# Patient Record
Sex: Male | Born: 1960 | Race: White | Hispanic: No | Marital: Married | Smoking: Current every day smoker
Health system: Southern US, Community
[De-identification: ages and names within clinical notes are randomized; demographics above are authoritative.]

## PROBLEM LIST (undated history)

## (undated) DIAGNOSIS — Z720 Tobacco use: Secondary | ICD-10-CM

---

## 2011-06-10 ENCOUNTER — Emergency Department: Payer: Self-pay | Admitting: Emergency Medicine

## 2011-06-10 IMAGING — CT CT HEAD WITHOUT CONTRAST
2 series · 15 of 30 positions shown, 19 images · non-contrast
Comparison: none

REASON FOR EXAM: forehead hematoma
COMMENTS:   LMP: (Male)

[Series 2: without · axial · non-contrast · 0.47mm/px · z∈[-173,-48]mm · 13 of 31 slices shown, 17 images]
[im 3/31  brain]
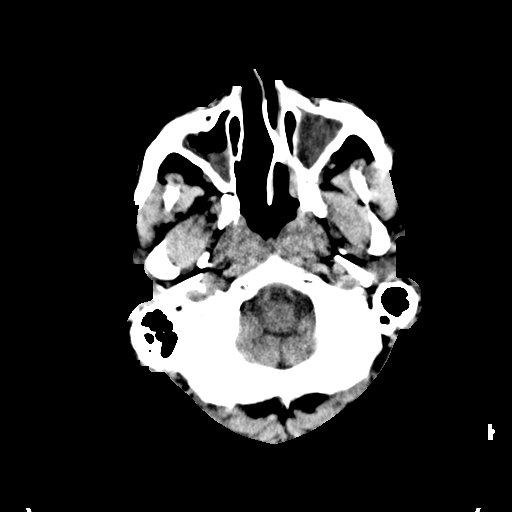
[im 3/31  bone]
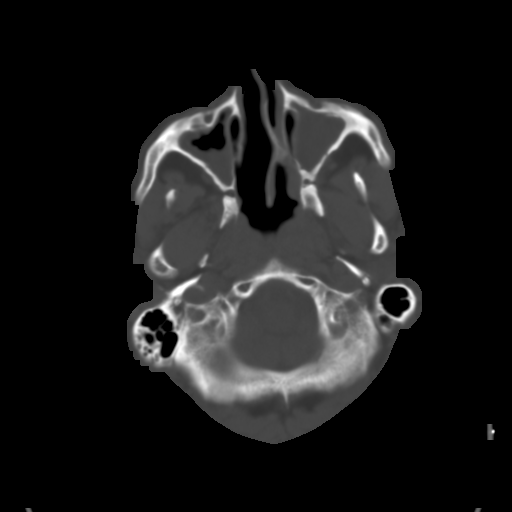
[im 5/31  brain]
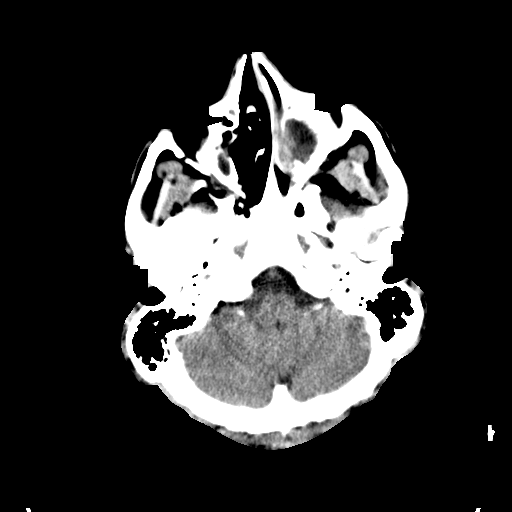
[im 7/31  brain]
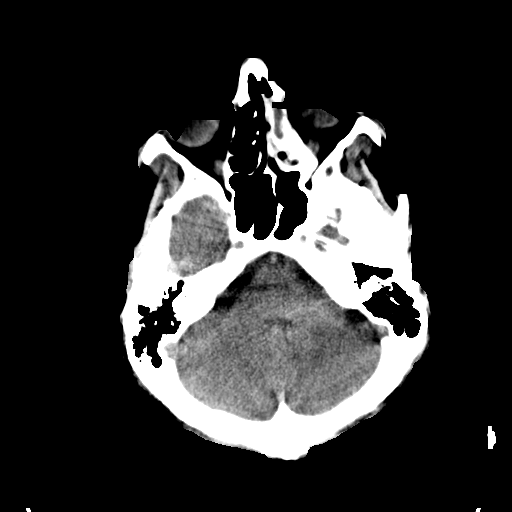
[im 9/31  brain]
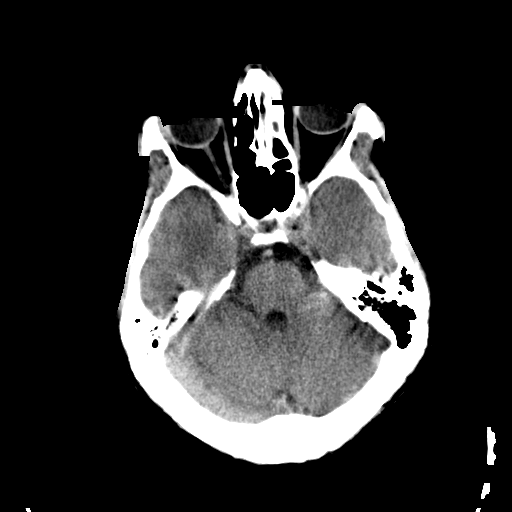
[im 11/31  brain]
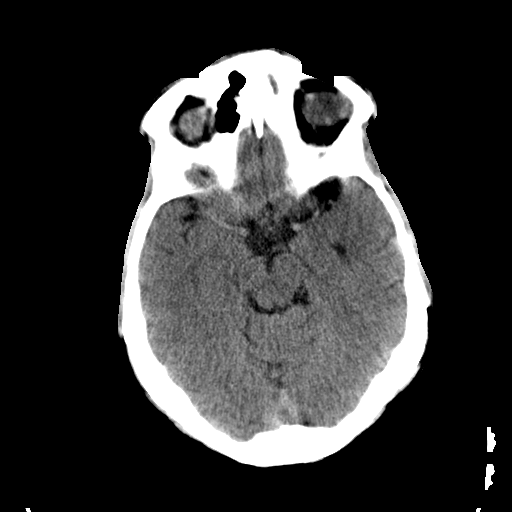
[im 11/31  bone]
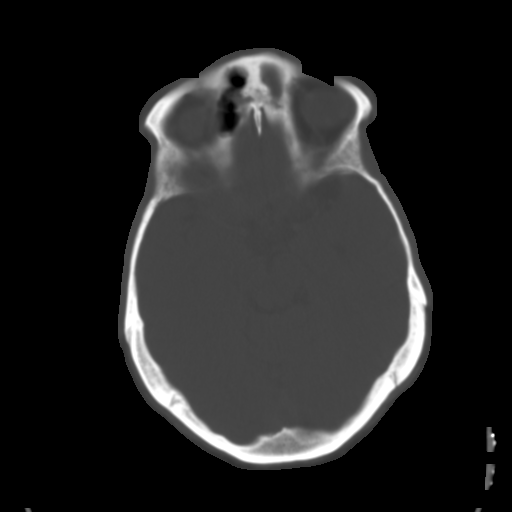
[im 13/31  brain]
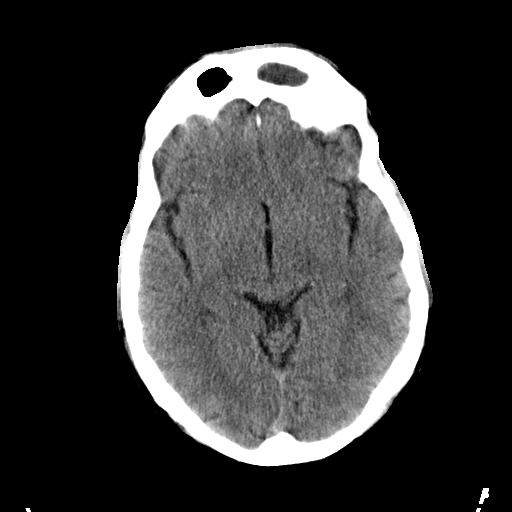
[im 16/31  brain]
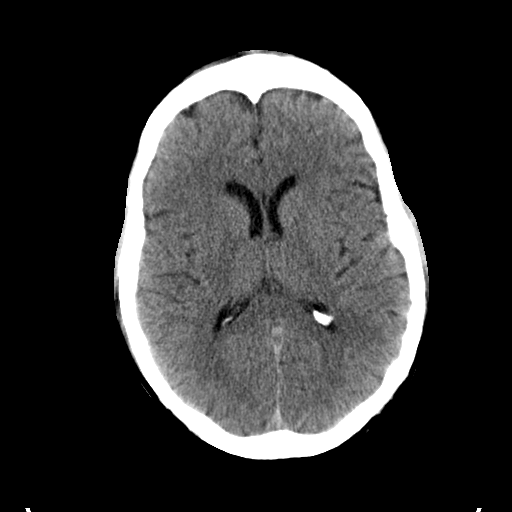
[im 18/31  brain]
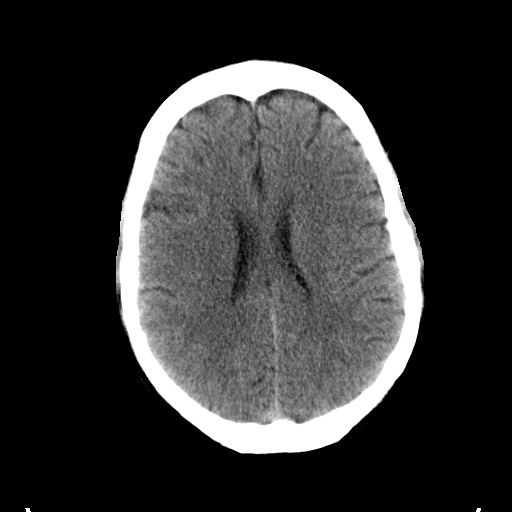
[im 20/31  brain]
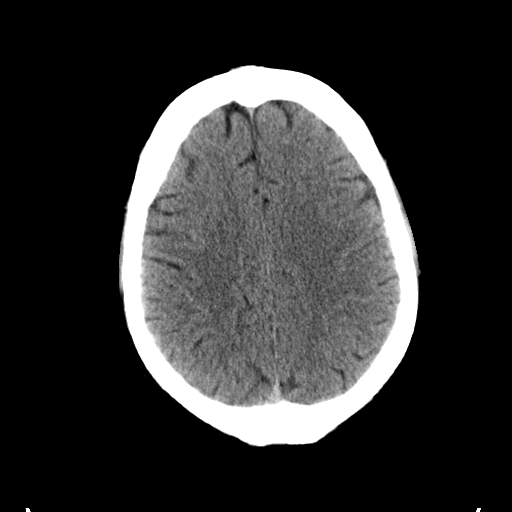
[im 20/31  bone]
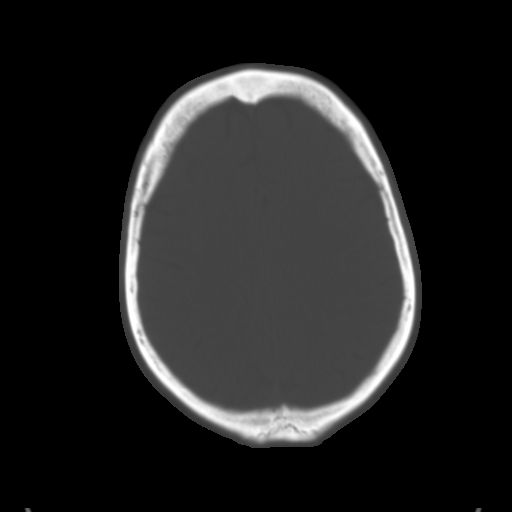
[im 22/31  brain]
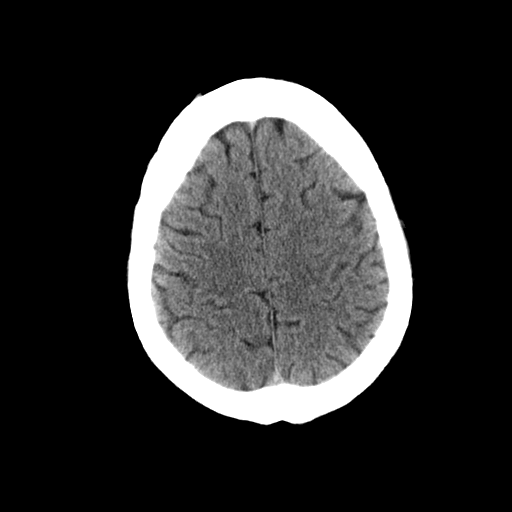
[im 24/31  brain]
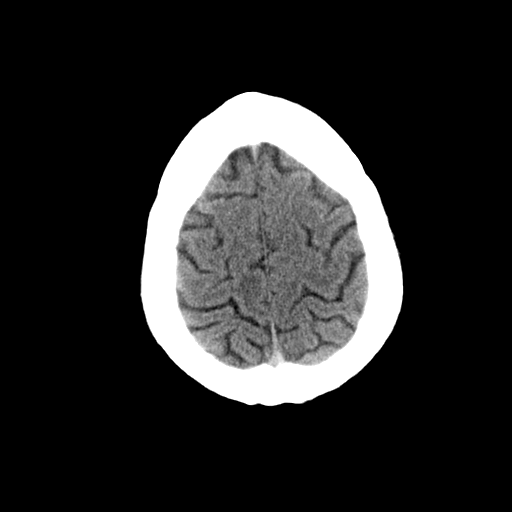
[im 26/31  brain]
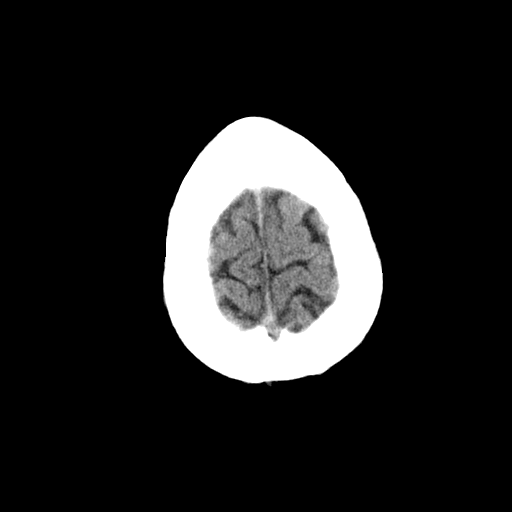
[im 28/31  brain]
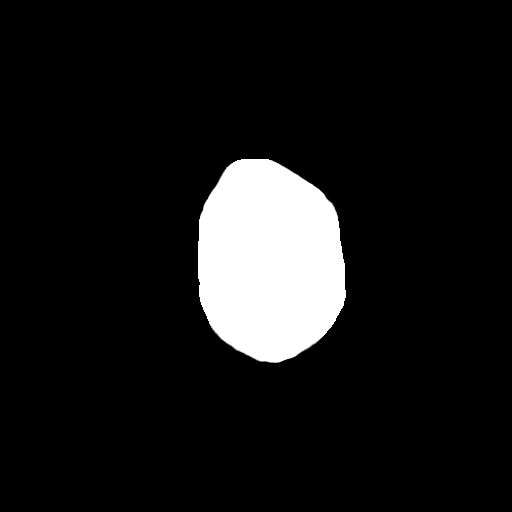
[im 28/31  bone]
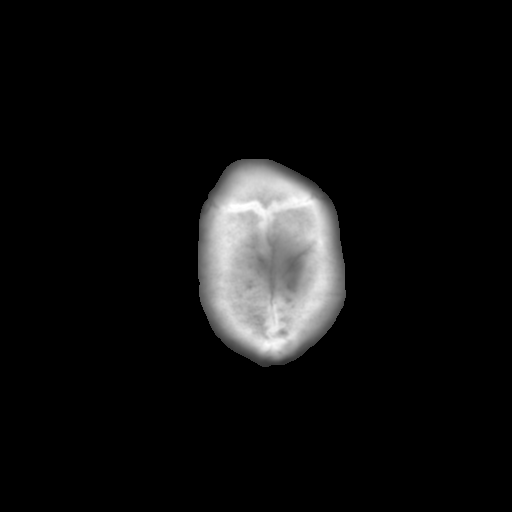

[Series 3: bone · axial · 0.47mm/px · z∈[-173,-153]mm · 2 of 31 slices shown]
[im 3/31  bone]
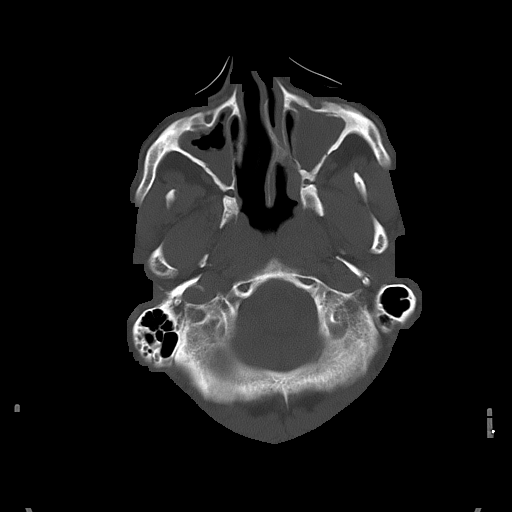
[im 7/31  bone]
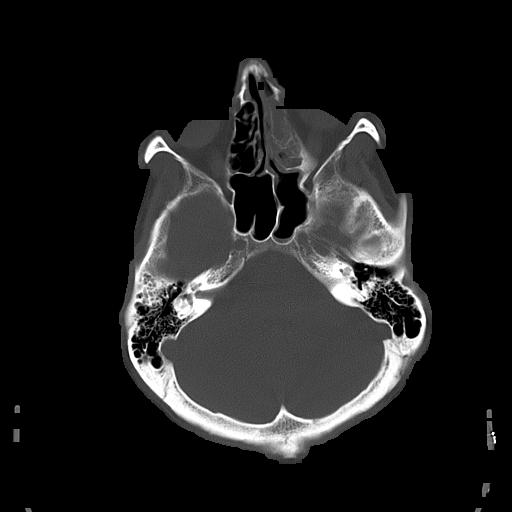

[15 of 30 positions shown; findings below may reference images not displayed]

PROCEDURE:     CT  - CT HEAD WITHOUT CONTRAST  - [DATE] [DATE]

RESULT:     Axial noncontrast CT scanning was performed through the brain
with reconstructions at 5 mm intervals and slice thicknesses.

The ventricles are normal in size and position. There is no intracranial
hemorrhage nor intracranial mass effect. There is no evidence of an evolving
ischemic infarction. There are no abnormal intracranial calcifications. The
cerebellum and brainstem are normal in density.

At bone window settings there is a cephalohematoma over the forehead. No
depressed skull fracture deep to the area of hematoma is seen. There is
opacification of the left frontal sinus and partial opacification of the
right frontal sinus. The left ethmoid and both maxillary sinuses are largely
opacified. The sphenoid sinuses are clear. The mastoid air cells are clear.
Linear lucencies along the posterior aspect of the frontal sinus on the left
are present and may be posttraumatic or developmental.
IMPRESSION: 1. There is no evidence of acute intracranial hemorrhage.
2. There are findings which likely reflect pan sinus inflammation consistent
with sinusitis. There are faint linear lucencies along the posterior wall of
the left frontal sinus which may communicate with the CSF space. This may be
acute or chronic.
3. There is a cephalohematoma over the forehead without evidence of a
depressed skull fracture.

Correlation with the duration of the patient's symptoms and the presence of
fever or headache prior to the head trauma is needed. It may be that some of
the patient's symptoms are merely due to severe sinus infection, but the
possibility of a previously infected frontal sinus communicating with the
anterior cranial fossa and CSF spaces is raised.

## 2011-06-18 ENCOUNTER — Emergency Department: Payer: Self-pay | Admitting: Emergency Medicine

## 2011-06-19 ENCOUNTER — Emergency Department: Payer: Self-pay | Admitting: Emergency Medicine

## 2019-06-19 ENCOUNTER — Other Ambulatory Visit: Payer: Self-pay

## 2019-06-19 DIAGNOSIS — K648 Other hemorrhoids: Secondary | ICD-10-CM | POA: Diagnosis not present

## 2019-06-19 DIAGNOSIS — K6289 Other specified diseases of anus and rectum: Secondary | ICD-10-CM | POA: Diagnosis present

## 2019-06-19 DIAGNOSIS — F17200 Nicotine dependence, unspecified, uncomplicated: Secondary | ICD-10-CM | POA: Insufficient documentation

## 2019-06-19 DIAGNOSIS — K4090 Unilateral inguinal hernia, without obstruction or gangrene, not specified as recurrent: Secondary | ICD-10-CM | POA: Diagnosis not present

## 2019-06-19 LAB — CBC
HCT: 42.4 % (ref 39.0–52.0)
Hemoglobin: 14.2 g/dL (ref 13.0–17.0)
MCH: 30.7 pg (ref 26.0–34.0)
MCHC: 33.5 g/dL (ref 30.0–36.0)
MCV: 91.8 fL (ref 80.0–100.0)
Platelets: 189 10*3/uL (ref 150–400)
RBC: 4.62 MIL/uL (ref 4.22–5.81)
RDW: 11.9 % (ref 11.5–15.5)
WBC: 9.5 10*3/uL (ref 4.0–10.5)
nRBC: 0 % (ref 0.0–0.2)

## 2019-06-19 LAB — COMPREHENSIVE METABOLIC PANEL
ALT: 12 U/L (ref 0–44)
AST: 18 U/L (ref 15–41)
Albumin: 4 g/dL (ref 3.5–5.0)
Alkaline Phosphatase: 56 U/L (ref 38–126)
Anion gap: 9 (ref 5–15)
BUN: 16 mg/dL (ref 6–20)
CO2: 24 mmol/L (ref 22–32)
Calcium: 8.9 mg/dL (ref 8.9–10.3)
Chloride: 104 mmol/L (ref 98–111)
Creatinine, Ser: 0.84 mg/dL (ref 0.61–1.24)
GFR calc Af Amer: >60 mL/min (ref >60)
GFR calc non Af Amer: >60 mL/min (ref >60)
Glucose, Bld: 120 mg/dL — ABNORMAL HIGH (ref 70–99)
Potassium: 4.3 mmol/L (ref 3.5–5.1)
Sodium: 137 mmol/L (ref 135–145)
Total Bilirubin: 0.6 mg/dL (ref 0.3–1.2)
Total Protein: 7.1 g/dL (ref 6.5–8.1)

## 2019-06-19 NOTE — ED Notes (Signed)
First Nurse Note: Pt to ED from [facility] in. Pt presented to walk in stating that he felt like he had a prolapsed rectum or hemorrhoid. Pt also stated that he felt like he was having trouble urinating and that he felt like his left testicle was "tangled". Pt is in NAD.

## 2019-06-19 NOTE — ED Triage Notes (Signed)
Pt states rectal bleeding when he wipes. Began yesterday. C/o rectal pain. Sent from [facility] for possible rectal prolapse. Pt states "I can't tell if I have gas or need to do #2." A&O, in wheelchair. States had hemorrhoids when he was younger but unsure about now. Denies blood thinners. Skin color appears well. Doesn't appear pale. States hx of prostate issues. Also c/o "I don't know when I need to pee."

## 2019-06-20 ENCOUNTER — Emergency Department
Admission: EM | Admit: 2019-06-20 | Discharge: 2019-06-20 | Disposition: A | Discharge status: Home / Self Care | Payer: BC Managed Care – PPO | Attending: Emergency Medicine | Admitting: Emergency Medicine

## 2019-06-20 DIAGNOSIS — K648 Other hemorrhoids: Secondary | ICD-10-CM

## 2019-06-20 DIAGNOSIS — K4090 Unilateral inguinal hernia, without obstruction or gangrene, not specified as recurrent: Secondary | ICD-10-CM

## 2019-06-20 MED ORDER — HYDROCORTISONE ACETATE 25 MG RE SUPP: 25.0000 mg | Freq: Two times a day (BID) | RECTAL | 0 refills | Status: AC

## 2019-06-20 MED ORDER — DOCUSATE SODIUM 250 MG PO CAPS: 250.0000 mg | ORAL_CAPSULE | Freq: Every day | ORAL | 0 refills | Status: AC

## 2019-06-20 NOTE — ED Provider Notes (Signed)
[facility] Emergency Department Provider Note  ____________________________________________   First MD Initiated Contact with Patient 06/20/19 [phone]     (approximate)  I have reviewed the triage vital signs and the nursing notes.   HISTORY  Chief Complaint Rectal Pain    HPI Nicholas Lang is a 59 y.o. male  Here with rectal pressure and pain. Pt reports that for the last week or so, he's had progressively worsening pressure like sensation in his anus, with sensation of his rectum "falling out" when urinating or straining. This has been an intermittent chronic issue for months to years, but has become more painful over the past week or so. He states he has also occasionally noticed some fullness and pain in his left inguinal area, though no testicular pain or swelling or tenderness. Denies any known h./o hemorrhoids but does note some occasional bright red blood after wiping. He does not recall a h/o diverticulosis but has never had a colonoscopy. No family h/o colon CA. No personal h/o abd surgeries. Denies any fever, chills, weight loss, night sweats. No urinary symptoms other than feeling a pressure like sensation when he needs to pee. He reports h/o prostate problems but does not take any medications. No overt dysuria. No fever, chills. No other complaints.        History reviewed. No pertinent past medical history.  There are no problems to display for this patient.   History reviewed. No pertinent surgical history.  Prior to Admission medications   Medication Sig Start Date End Date Taking? Authorizing Provider  docusate sodium (COLACE) 250 MG capsule Take 1 capsule (250 mg total) by mouth daily. 06/20/19 07/20/19  Shaune Pollack, MD  hydrocortisone (ANUSOL-HC) 25 MG suppository Place 1 suppository (25 mg total) rectally 2 (two) times daily for 7 days. 06/20/19 06/27/19  Shaune Pollack, MD    Allergies Patient has no known allergies.  History reviewed.  No pertinent family history.  Social History Social History   Tobacco Use  . Smoking status: Current Every Day Smoker  Substance Use Topics  . Alcohol use: Not Currently  . Drug use: Not on file    Review of Systems  Review of Systems  Constitutional: Negative for chills and fever.  HENT: Negative for sore throat.   Respiratory: Negative for shortness of breath.   Cardiovascular: Negative for chest pain.  Gastrointestinal: Positive for blood in stool and rectal pain. Negative for abdominal pain.  Genitourinary: Negative for flank pain.  Musculoskeletal: Negative for neck pain.  Skin: Negative for rash and wound.  Allergic/Immunologic: Negative for immunocompromised state.  Neurological: Negative for weakness and numbness.  Hematological: Does not bruise/bleed easily.     ____________________________________________  PHYSICAL EXAM:      VITAL SIGNS: ED Triage Vitals  Enc Vitals Group     BP 06/19/19 1638 112/82     Pulse Rate 06/19/19 1638 77     Resp 06/19/19 1638 16     Temp 06/19/19 1638 98.1 F (36.7 C)     Temp Source 06/19/19 1638 Oral     SpO2 06/19/19 1638 98 %     Weight 06/19/19 1640 140 lb (63.5 kg)     Height 06/19/19 1640 6' (1.829 m)     Head Circumference --      Peak Flow --      Pain Score 06/19/19 1638 5     Pain Loc --      Pain Edu? --  Excl. in [place]? --      Physical Exam Vitals and nursing note reviewed.  Constitutional:      General: He is not in acute distress.    Appearance: He is well-developed.  HENT:     Head: Normocephalic and atraumatic.  Eyes:     Conjunctiva/sclera: Conjunctivae normal.  Cardiovascular:     Rate and Rhythm: Normal rate and regular rhythm.     Heart sounds: Normal heart sounds.  Pulmonary:     Effort: Pulmonary effort is normal. No respiratory distress.     Breath sounds: No wheezing.  Abdominal:     General: There is no distension.     Palpations: Abdomen is soft.     Tenderness: There is no  abdominal tenderness. There is no guarding or rebound.     Hernia: No hernia is present.  Genitourinary:    Comments: Soft, reducible left indirect inguinal hernia. Testes descended b/l with intact cremasterics and no scrotal erythema or swelling. On rectal exam, marked TTP noted with significant internal hemorrhoids, one with prolapse on straining. No active bleeding. Musculoskeletal:     Cervical back: Neck supple.  Skin:    General: Skin is warm.     Capillary Refill: Capillary refill takes less than 2 seconds.     Findings: No rash.  Neurological:     Mental Status: He is alert and oriented to person, place, and time.     Motor: No abnormal muscle tone.       ____________________________________________   LABS (all labs ordered are listed, but only abnormal results are displayed)  Labs Reviewed  COMPREHENSIVE METABOLIC PANEL - Abnormal; Notable for the following components:      Result Value   Glucose, Bld 120 (*)    All other components within normal limits  CBC    ____________________________________________  EKG: None ________________________________________  RADIOLOGY All imaging, including plain films, CT scans, and ultrasounds, independently reviewed by me, and interpretations confirmed via formal radiology reads.  ED MD interpretation:   None  Official radiology report(s): No results found.  ____________________________________________  PROCEDURES   Procedure(s) performed (including Critical Care):  Procedures  ____________________________________________  INITIAL IMPRESSION / MDM / [place] / ED COURSE  As part of my medical decision making, I reviewed the following data within the [place] notes reviewed and incorporated, Old chart reviewed, Notes from prior ED visits, and [place] Controlled Substance Database       *Nicholas Lang was evaluated in Emergency Department on 06/20/2019 for the symptoms described in  the history of present illness. He was evaluated in the context of the global COVID-19 pandemic, which necessitated consideration that the patient might be at risk for infection with the SARS-CoV-2 virus that causes COVID-19. Institutional protocols and algorithms that pertain to the evaluation of patients at risk for COVID-19 are in a state of rapid change based on information released by regulatory bodies including the CDC and federal and state organizations. These policies and algorithms were followed during the patient's care in the ED.  Some ED evaluations and interventions may be delayed as a result of limited staffing during the pandemic.*     Medical Decision Making:  59 yo M here with rectal pain/swelling, sensation of prolapse. On exam, pt does have significant internal hemorrhoids with mild prolapse, but no signs of clotting, inflammation, or infection. He has no risk factors for or symptoms of an infectious proctitis. He is AF, well appearing and abdomen  is soft, NT, ND, with normal WBC - doubt infectious process. His urinary complaints are more so a sensation of fullness in his anal area, and clinically do not suspect UTI, prostatitis. Regarding his bleeding, his Hgb is stable and I suspect this is bright red blood from hemorrhoids, not actively hemorrhaging.   Given pt's well appearance and otherwise benign exam, feel it's reasonable to trial stool softeners, suppositories and refer to GI. He likely will need a colonoscopy given his age and complaints. From a GU perspective, he did not provide a urine sample but has no dysuria, frequency. He reports h/o prostate issues so can f/u with Urologist. No bladder distension and he is voiding without difficulty here.  ____________________________________________  FINAL CLINICAL IMPRESSION(S) / ED DIAGNOSES  Final diagnoses:  Internal hemorrhoids  Indirect inguinal hernia     MEDICATIONS GIVEN DURING THIS VISIT:  Medications - No data to  display   ED Discharge Orders         Ordered    hydrocortisone (ANUSOL-HC) 25 MG suppository  2 times daily     06/20/19 0348    docusate sodium (COLACE) 250 MG capsule  Daily     06/20/19 0348           Note:  This document was prepared using Dragon voice recognition software and may include unintentional dictation errors.   Shaune Pollack, MD 06/20/19 1003

## 2019-06-20 NOTE — Discharge Instructions (Addendum)
As we discussed, I'd recommend starting the suppositories and stool softener.  Try to minimize heavy lifting. When you do lift, take deep breaths.  IT IS VERY IMPORTANT TO CALL GI OR A NEW PRIMARY TO SET UP FOLLOW-UP. While your symptoms are likely from hemorrhoids, it's important to follow up and make sure there is no mass, lesion, cancer, or other problem leading to these. You should discuss having a colonoscopy.

## 2021-03-10 DIAGNOSIS — R42 Dizziness and giddiness: Secondary | ICD-10-CM | POA: Diagnosis not present

## 2021-03-10 DIAGNOSIS — Z03818 Encounter for observation for suspected exposure to other biological agents ruled out: Secondary | ICD-10-CM | POA: Diagnosis not present

## 2021-03-10 DIAGNOSIS — R9431 Abnormal electrocardiogram [ECG] [EKG]: Secondary | ICD-10-CM | POA: Diagnosis not present

## 2021-03-12 ENCOUNTER — Emergency Department: Payer: 59

## 2021-03-12 ENCOUNTER — Encounter: Payer: Self-pay | Admitting: Emergency Medicine

## 2021-03-12 ENCOUNTER — Observation Stay: Payer: 59

## 2021-03-12 ENCOUNTER — Other Ambulatory Visit: Payer: Self-pay

## 2021-03-12 ENCOUNTER — Observation Stay (HOSPITAL_BASED_OUTPATIENT_CLINIC_OR_DEPARTMENT_OTHER)
Admit: 2021-03-12 | Discharge: 2021-03-12 | Disposition: A | Discharge status: Home / Self Care | Payer: 59 | Attending: Internal Medicine | Admitting: Internal Medicine

## 2021-03-12 ENCOUNTER — Observation Stay
Admission: EM | Admit: 2021-03-12 | Discharge: 2021-03-13 | Disposition: A | Discharge status: Home / Self Care | Payer: 59 | Attending: Internal Medicine | Admitting: Internal Medicine

## 2021-03-12 DIAGNOSIS — R079 Chest pain, unspecified: Secondary | ICD-10-CM | POA: Diagnosis not present

## 2021-03-12 DIAGNOSIS — I639 Cerebral infarction, unspecified: Principal | ICD-10-CM | POA: Diagnosis present

## 2021-03-12 DIAGNOSIS — I6389 Other cerebral infarction: Secondary | ICD-10-CM

## 2021-03-12 DIAGNOSIS — Z20822 Contact with and (suspected) exposure to covid-19: Secondary | ICD-10-CM | POA: Diagnosis not present

## 2021-03-12 DIAGNOSIS — R42 Dizziness and giddiness: Secondary | ICD-10-CM | POA: Diagnosis not present

## 2021-03-12 DIAGNOSIS — R918 Other nonspecific abnormal finding of lung field: Secondary | ICD-10-CM | POA: Diagnosis not present

## 2021-03-12 DIAGNOSIS — I700 Atherosclerosis of aorta: Secondary | ICD-10-CM | POA: Diagnosis not present

## 2021-03-12 DIAGNOSIS — R911 Solitary pulmonary nodule: Secondary | ICD-10-CM | POA: Diagnosis not present

## 2021-03-12 DIAGNOSIS — F17200 Nicotine dependence, unspecified, uncomplicated: Secondary | ICD-10-CM | POA: Diagnosis present

## 2021-03-12 DIAGNOSIS — J439 Emphysema, unspecified: Secondary | ICD-10-CM | POA: Diagnosis not present

## 2021-03-12 DIAGNOSIS — Z8673 Personal history of transient ischemic attack (TIA), and cerebral infarction without residual deficits: Secondary | ICD-10-CM | POA: Diagnosis not present

## 2021-03-12 DIAGNOSIS — R001 Bradycardia, unspecified: Secondary | ICD-10-CM | POA: Diagnosis not present

## 2021-03-12 DIAGNOSIS — M50321 Other cervical disc degeneration at C4-C5 level: Secondary | ICD-10-CM | POA: Diagnosis not present

## 2021-03-12 DIAGNOSIS — I6523 Occlusion and stenosis of bilateral carotid arteries: Secondary | ICD-10-CM | POA: Diagnosis not present

## 2021-03-12 DIAGNOSIS — Z716 Tobacco abuse counseling: Secondary | ICD-10-CM

## 2021-03-12 DIAGNOSIS — M50322 Other cervical disc degeneration at C5-C6 level: Secondary | ICD-10-CM | POA: Diagnosis not present

## 2021-03-12 HISTORY — DX: Tobacco use: Z72.0

## 2021-03-12 LAB — CBC WITH DIFFERENTIAL/PLATELET
Abs Immature Granulocytes: 0.01 10*3/uL (ref 0.00–0.07)
Basophils Absolute: 0 10*3/uL (ref 0.0–0.1)
Basophils Relative: 1 %
Eosinophils Absolute: 0.2 10*3/uL (ref 0.0–0.5)
Eosinophils Relative: 2 %
HCT: 44.1 % (ref 39.0–52.0)
Hemoglobin: 15.4 g/dL (ref 13.0–17.0)
Immature Granulocytes: 0 %
Lymphocytes Relative: 38 %
Lymphs Abs: 3.3 10*3/uL (ref 0.7–4.0)
MCH: 32.6 pg (ref 26.0–34.0)
MCHC: 34.9 g/dL (ref 30.0–36.0)
MCV: 93.2 fL (ref 80.0–100.0)
Monocytes Absolute: 0.6 10*3/uL (ref 0.1–1.0)
Monocytes Relative: 7 %
Neutro Abs: 4.4 10*3/uL (ref 1.7–7.7)
Neutrophils Relative %: 52 %
Platelets: 208 10*3/uL (ref 150–400)
RBC: 4.73 MIL/uL (ref 4.22–5.81)
RDW: 11.9 % (ref 11.5–15.5)
WBC: 8.5 10*3/uL (ref 4.0–10.5)
nRBC: 0 % (ref 0.0–0.2)

## 2021-03-12 LAB — COMPREHENSIVE METABOLIC PANEL
ALT: 9 U/L (ref 0–44)
AST: 17 U/L (ref 15–41)
Albumin: 4.5 g/dL (ref 3.5–5.0)
Alkaline Phosphatase: 60 U/L (ref 38–126)
Anion gap: 12 (ref 5–15)
BUN: 15 mg/dL (ref 6–20)
CO2: 28 mmol/L (ref 22–32)
Calcium: 9.5 mg/dL (ref 8.9–10.3)
Chloride: 101 mmol/L (ref 98–111)
Creatinine, Ser: 0.84 mg/dL (ref 0.61–1.24)
GFR, Estimated: >60 mL/min (ref >60)
Glucose, Bld: 85 mg/dL (ref 70–99)
Potassium: 4.4 mmol/L (ref 3.5–5.1)
Sodium: 141 mmol/L (ref 135–145)
Total Bilirubin: 0.8 mg/dL (ref 0.3–1.2)
Total Protein: 8 g/dL (ref 6.5–8.1)

## 2021-03-12 LAB — RESP PANEL BY RT-PCR (FLU A&B, COVID) ARPGX2
Influenza A by PCR: NEGATIVE
Influenza B by PCR: NEGATIVE
SARS Coronavirus 2 by RT PCR: NEGATIVE

## 2021-03-12 LAB — TROPONIN I (HIGH SENSITIVITY): Troponin I (High Sensitivity): 3 ng/L (ref <18)

## 2021-03-12 IMAGING — CT CT CHEST W/O CM
2 of 3 series · 14 of 36 positions shown, 17 images · non-contrast
Comparison: No priors.

CLINICAL DATA: 59-year-old male with history of pulmonary nodules.

EXAM:
CT CHEST WITHOUT CONTRAST
TECHNIQUE: Multidetector CT imaging of the chest was performed following the
standard protocol without IV contrast.

[Series 2: thorax · axial · 0.72mm/px · z∈[-133,+153]mm · 11 of 169 slices shown, 14 images]
[im 13/169  mediastinal]
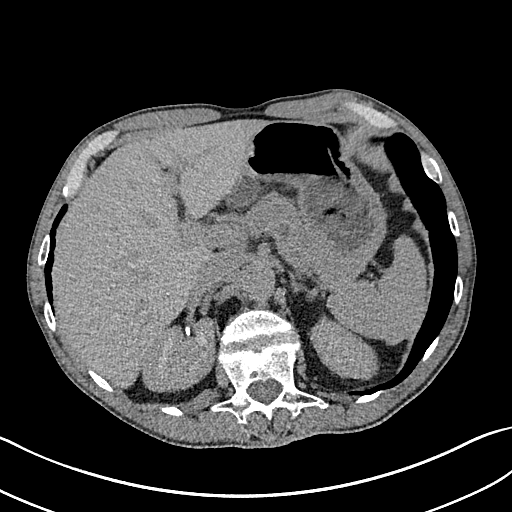
[im 13/169  lung]
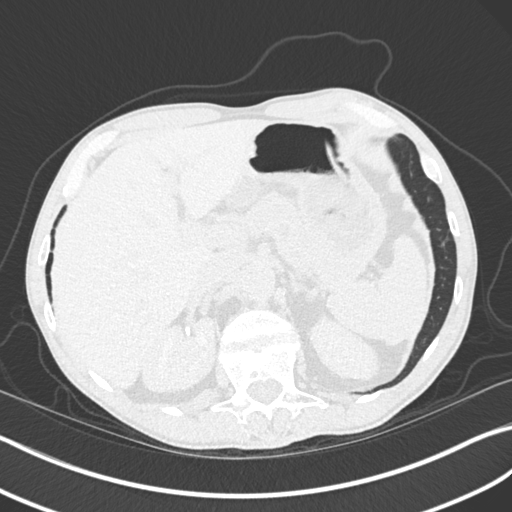
[im 25/169  lung]
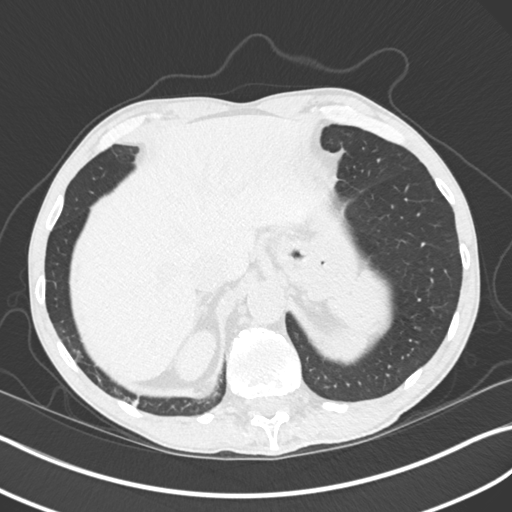
[im 38/169  lung]
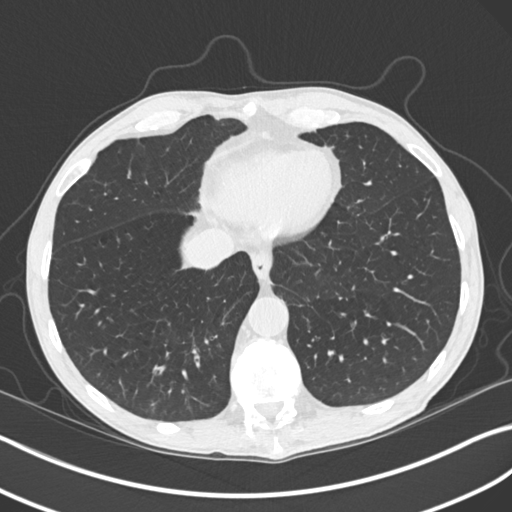
[im 57/169  lung]
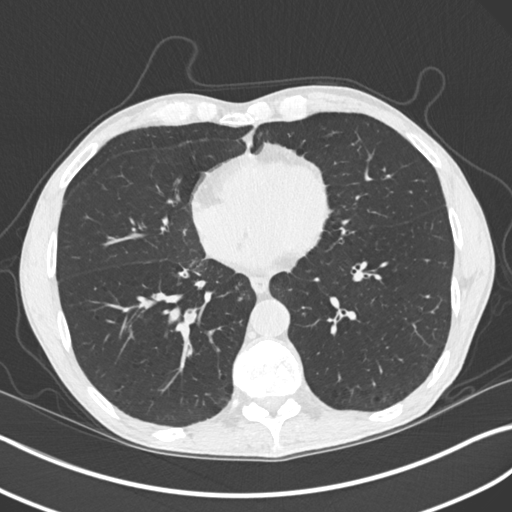
[im 69/169  mediastinal]
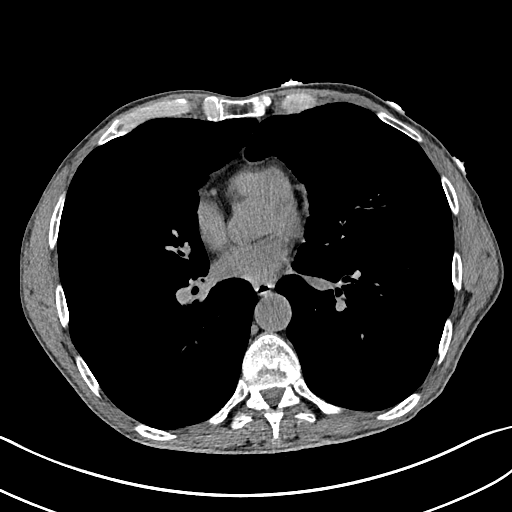
[im 69/169  lung]
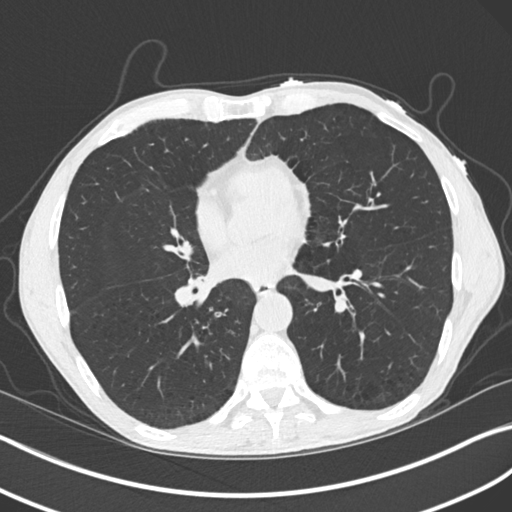
[im 88/169  lung]
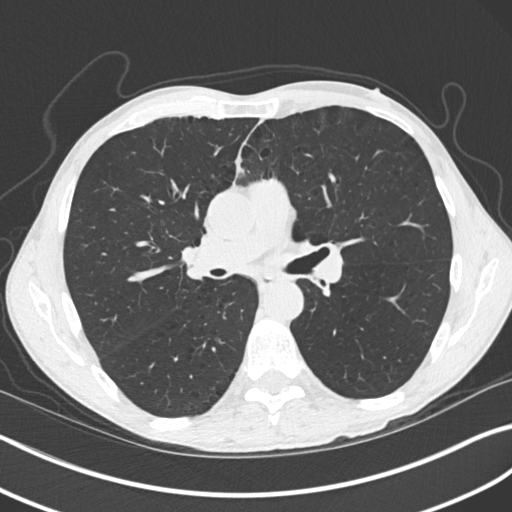
[im 100/169  lung]
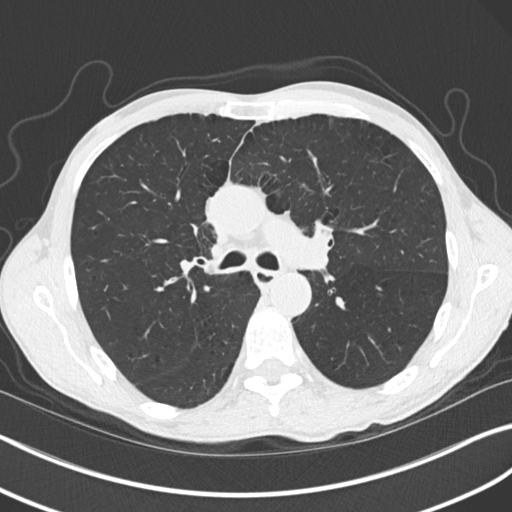
[im 113/169  lung]
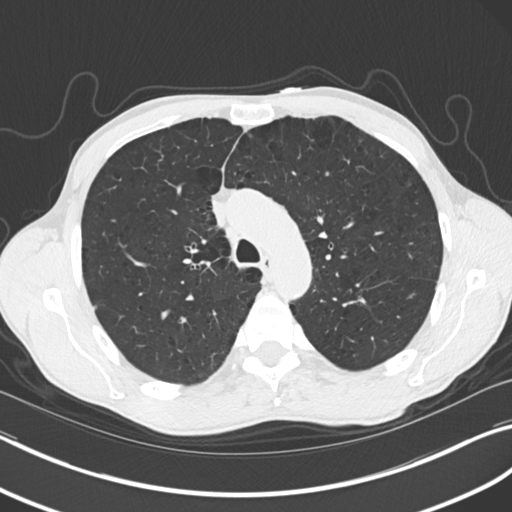
[im 131/169  mediastinal]
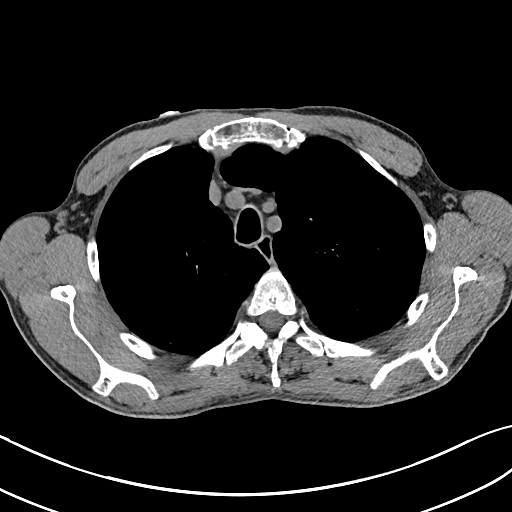
[im 131/169  lung]
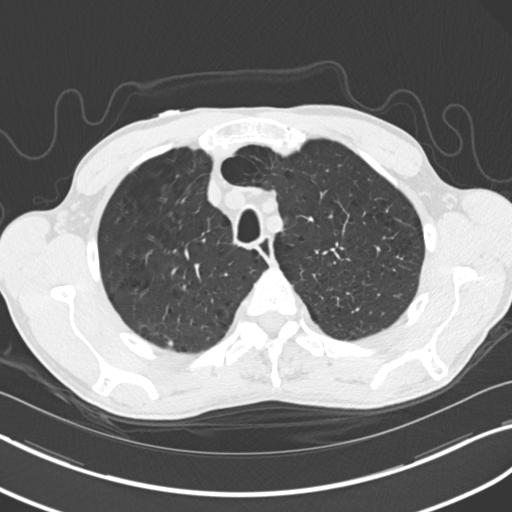
[im 144/169  lung]
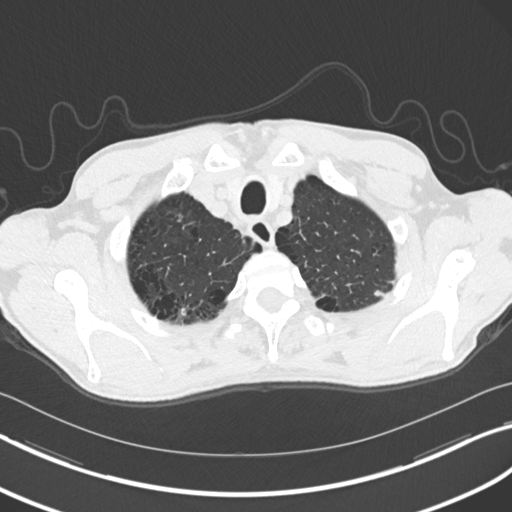
[im 156/169  lung]
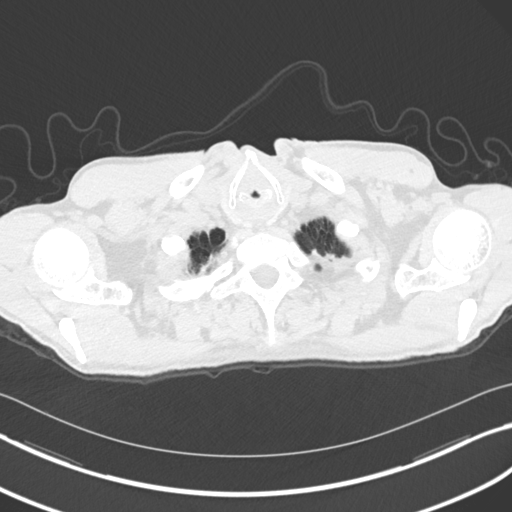

[Series 5: coronal · coronal · 0.69mm/px · 3 of 138 slices shown]
[im 28/138  lung]
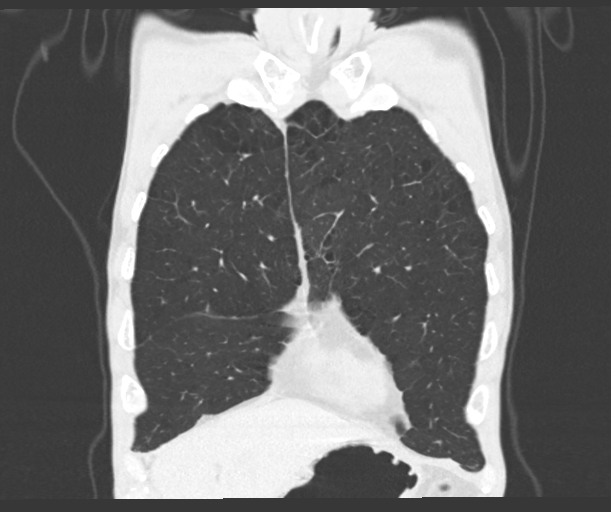
[im 55/138  lung]
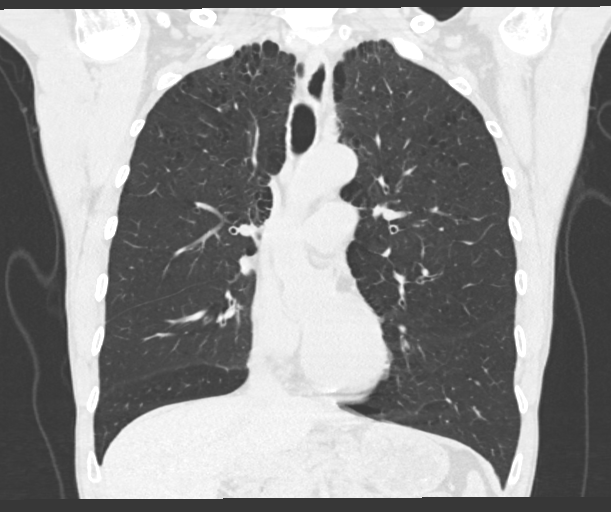
[im 83/138  lung]
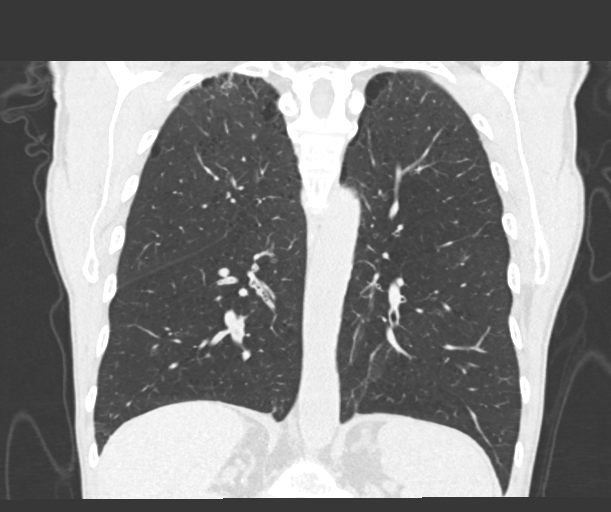

[14 of 36 positions shown; findings below may reference images not displayed]

FINDINGS: Cardiovascular: Heart size is normal. There is no significant
pericardial fluid, thickening or pericardial calcification. There is
aortic atherosclerosis, as well as atherosclerosis of the great
vessels of the mediastinum and the coronary arteries, including
calcified atherosclerotic plaque in the left anterior descending
coronary artery.

Mediastinum/Nodes: No pathologically enlarged mediastinal or hilar
lymph nodes. Please note that accurate exclusion of hilar adenopathy
is limited on noncontrast CT scans. Esophagus is unremarkable in
appearance. No axillary lymphadenopathy.

Lungs/Pleura: Extensive pleuroparenchymal thickening and nodular
architectural distortion in the apices of both lungs, most likely to
reflect areas of chronic post infectious or inflammatory scarring.
Several scattered tiny 2-5 mm pulmonary nodules are noted elsewhere
throughout the lungs, largest of which is in the posterior aspect of
the right lower lobe near the base (axial image 142 of series 3)
measuring 5 mm, nonspecific, but statistically likely benign. No
larger more suspicious appearing pulmonary nodules or masses are
noted. No acute consolidative airspace disease. No pleural
effusions. Diffuse bronchial wall thickening with moderate
centrilobular and paraseptal emphysema.

Upper Abdomen: Aortic atherosclerosis. Iodinated contrast material
noted in the collecting systems of both kidneys related to recent
contrast enhanced CT angio of the head and neck.

Musculoskeletal: Chronic compression fracture of L1 with 60% loss of
anterior vertebral body height. Multiple Schmorl's nodes are
incidentally noted. There are no aggressive appearing lytic or
blastic lesions noted in the visualized portions of the skeleton.
IMPRESSION: 1. The findings on the recent CTA of the head neck likely reflect
areas of chronic post infectious or inflammatory scarring in the
lung apices. There are multiple small 2-5 mm pulmonary nodules noted
elsewhere in the lungs bilaterally, nonspecific, but statistically
likely benign. No follow-up needed if patient is low-risk (and has
no known or suspected primary neoplasm). Non-contrast chest CT can
be considered in 12 months if patient is high-risk. This
recommendation follows the consensus statement: Guidelines for
Management of Incidental Pulmonary Nodules Detected on CT Images:
2. Aortic atherosclerosis, in addition to left anterior descending
coronary artery disease. Please note that although the presence of
coronary artery calcium documents the presence of coronary artery
disease, the severity of this disease and any potential stenosis
cannot be assessed on this non-gated CT examination. Assessment for
potential risk factor modification, dietary therapy or pharmacologic
therapy may be warranted, if clinically indicated.
3. Diffuse bronchial wall thickening with moderate centrilobular and
paraseptal emphysema; imaging findings suggestive of underlying
COPD.

Aortic Atherosclerosis ([ZF]-[ZF]) and Emphysema ([ZF]-[ZF]).

## 2021-03-12 IMAGING — MR MR HEAD WO/W CM
14 series · 48 of 48 positions shown · IV contrast (6ml Gadavist)
Comparison: Same-day noncontrast CT head

CLINICAL DATA: Dizziness, neuro deficit

EXAM:
MRI HEAD WITHOUT AND WITH CONTRAST
TECHNIQUE: Multiplanar, multiecho pulse sequences of the brain and surrounding
structures were obtained without and with intravenous contrast.
CONTRAST:  6mL GADAVIST GADOBUTROL 1 MMOL/ML IV SOLN

[Series 5: ax dwi_tracew · axial · 3.0mm · 0.65mm/px · z∈[-41,+107]mm · 3 of 48 slices shown]
[im 1/48]
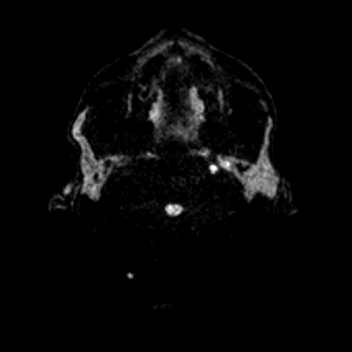
[im 24/48]
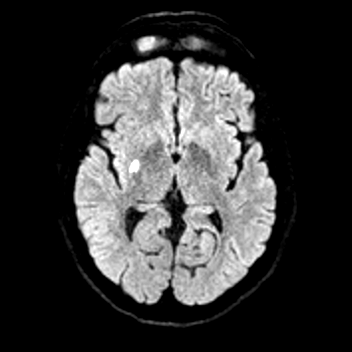
[im 48/48]
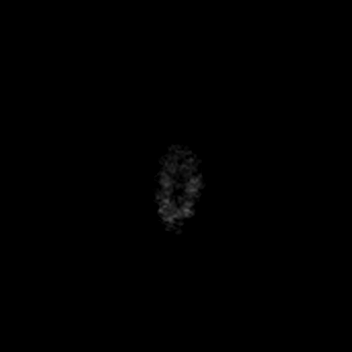

[Series 6: ax dwi_adc · axial · 3.0mm · 0.65mm/px · z∈[-41,+107]mm · 3 of 48 slices shown]
[im 1/48]
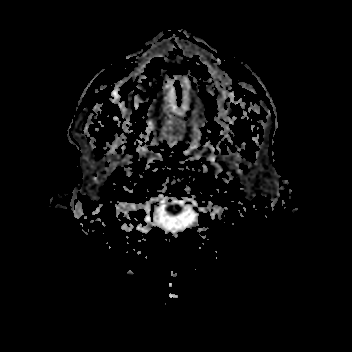
[im 24/48]
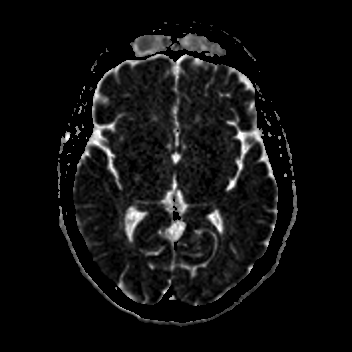
[im 48/48]
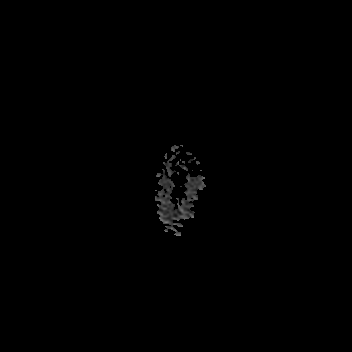

[Series 7: cor dwi_tracew · coronal · 5.0mm · 0.65mm/px · 2 of 40 slices shown]
[im 1/40]
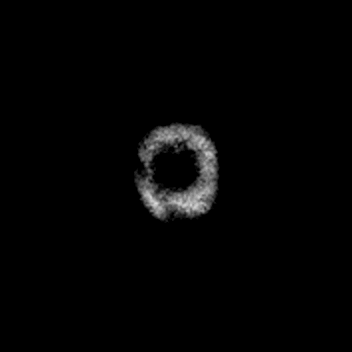
[im 40/40]
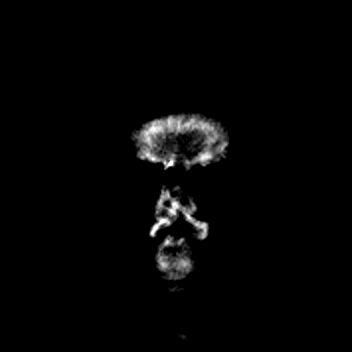

[Series 8: cor dwi_adc · coronal · 5.0mm · 0.65mm/px · 2 of 40 slices shown]
[im 1/40]
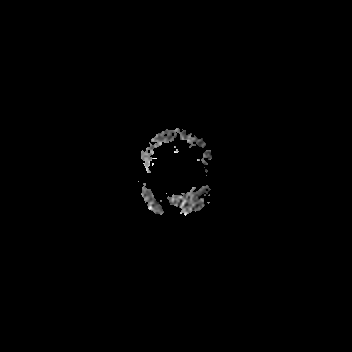
[im 40/40]
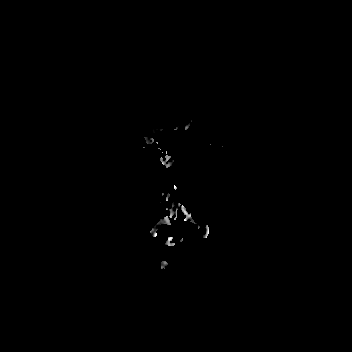

[Series 9: T1 · sagittal · 5.0mm · 0.62mm/px · 1 of 24 slices shown (1 of 2)]
[im 1/24]
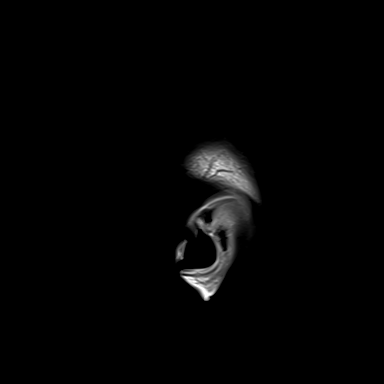

[Series 10: T2 · axial · 5.0mm · 0.53mm/px · 1 of 27 slices shown]
[im 1/27]
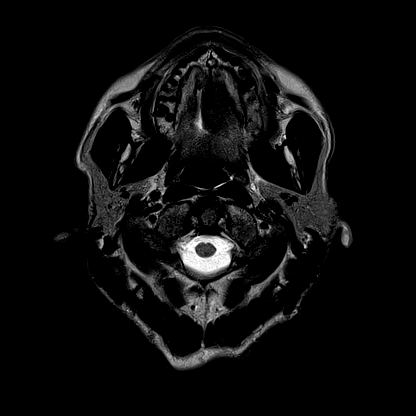

[Series 11: mag_images · axial · 3.0mm · 0.90mm/px · z∈[-55,+114]mm · 3 of 60 slices shown]
[im 1/60]
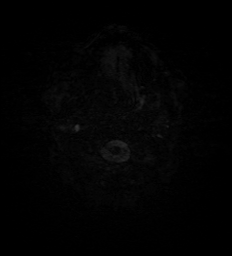
[im 30/60]
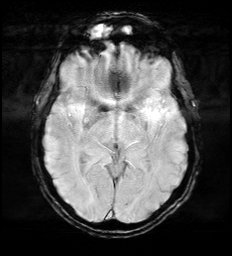
[im 60/60]
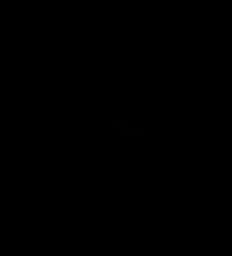

[Series 12: pha_images · axial · 3.0mm · 0.90mm/px · z∈[-55,+111]mm · 3 of 58 slices shown]
[im 1/58]
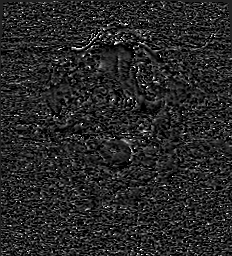
[im 29/58]
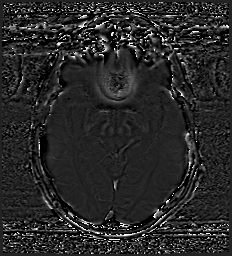
[im 58/58]
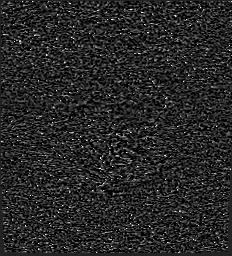

[Series 13: swi_images · axial · 3.0mm · 0.90mm/px · z∈[-55,+114]mm · 3 of 60 slices shown]
[im 1/60]
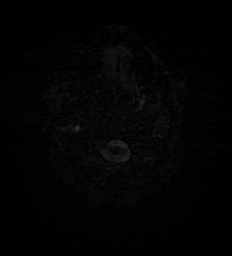
[im 30/60]
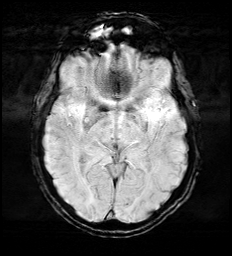
[im 60/60]
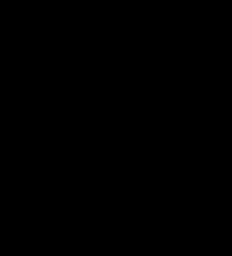

[Series 15: FLAIR · axial · 3.0mm · 0.53mm/px · z∈[-49,+106]mm · 3 of 55 slices shown]
[im 1/55]
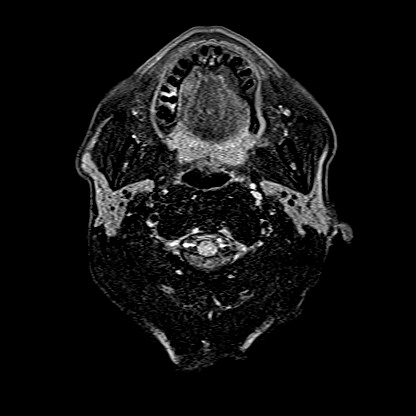
[im 28/55]
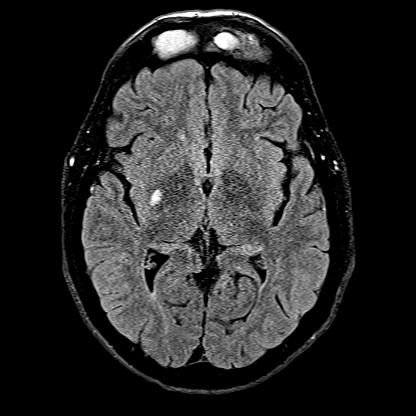
[im 55/55]
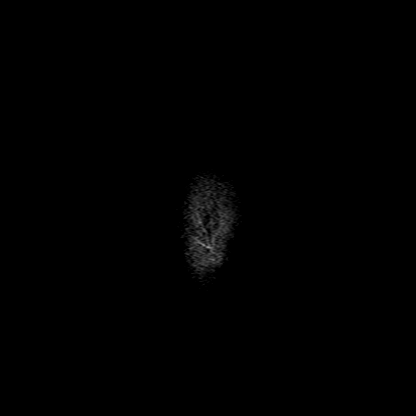

[Series 16: T1 · axial · 1.0mm · 0.98mm/px · z∈[-51,+116]mm · 10 of 176 slices shown (2 of 2)]
[im 1/176]
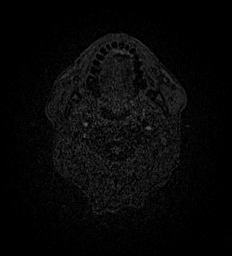
[im 20/176]
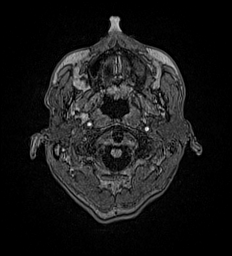
[im 39/176]
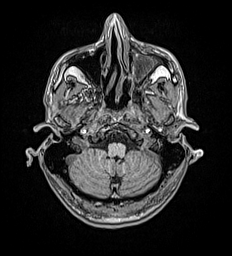
[im 59/176]
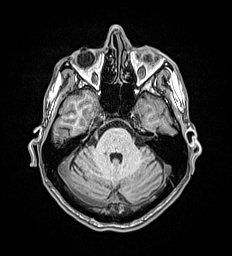
[im 78/176]
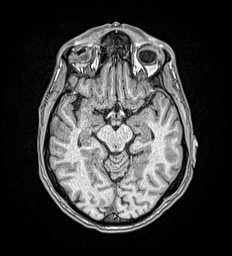
[im 98/176]
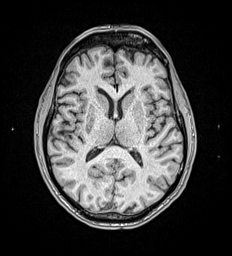
[im 117/176]
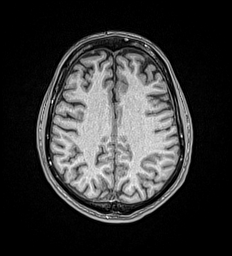
[im 137/176]
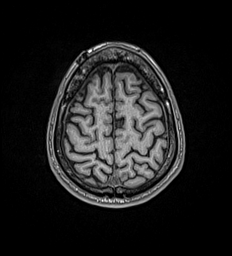
[im 156/176]
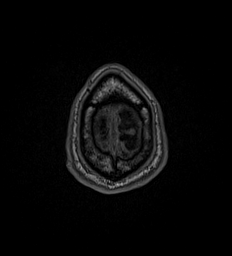
[im 176/176]
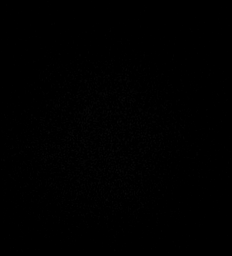

[Series 17: T2 post-contrast · coronal · 5.0mm · 0.57mm/px · 2 of 30 slices shown]
[im 1/30]
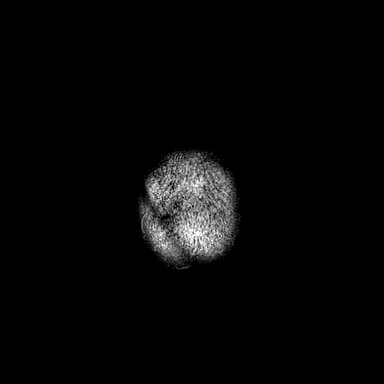
[im 30/30]
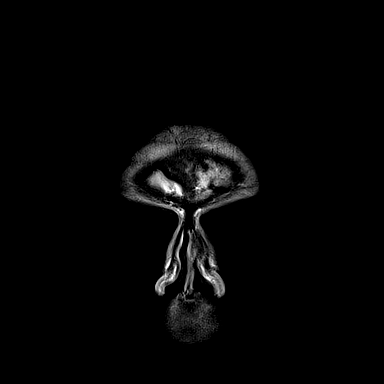

[Series 18: T1 post-contrast · axial · 1.0mm · 0.98mm/px · z∈[-51,+116]mm · 10 of 176 slices shown (1 of 2)]
[im 1/176]
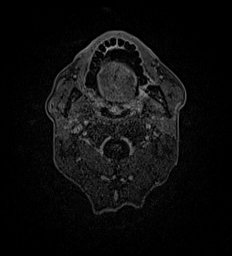
[im 20/176]
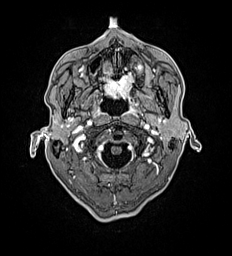
[im 39/176]
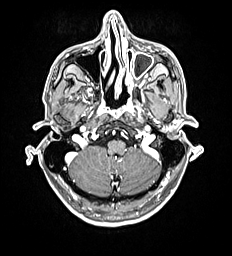
[im 59/176]
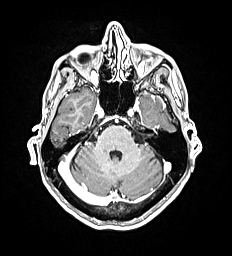
[im 78/176]
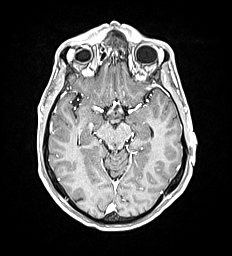
[im 98/176]
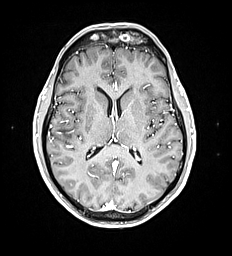
[im 117/176]
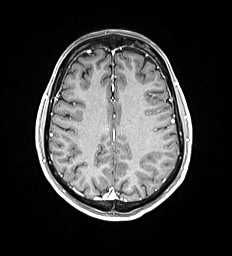
[im 137/176]
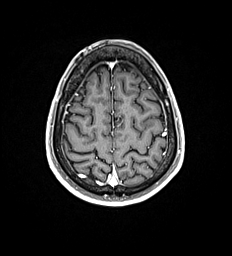
[im 156/176]
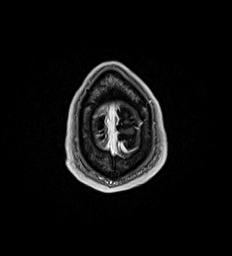
[im 176/176]
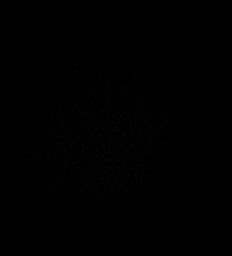

[Series 19: T1 post-contrast · coronal · 5.0mm · 0.57mm/px · 2 of 30 slices shown (2 of 2)]
[im 1/30]
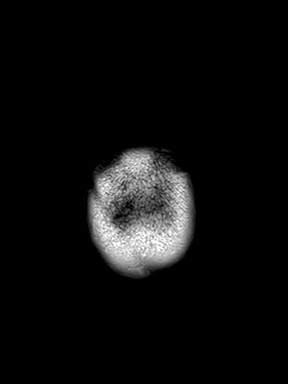
[im 30/30]
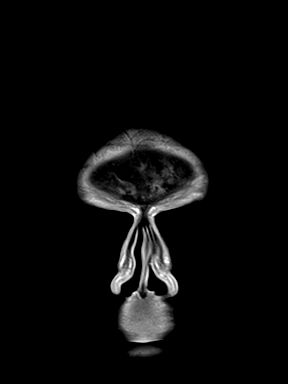

[48 of 48 positions shown; findings below may reference images not displayed]

FINDINGS: Brain: There is a subacute infarct in the right lentiform nucleus
extending to the caudate body. There is no evidence of associated
hemorrhage.

There is no other acute infarct. The brain parenchyma is otherwise
normal in appearance. The ventricles are normal in size. There is no
mass lesion. There is no midline shift. There is no abnormal
enhancement.

Vascular: Normal flow voids.

Skull and upper cervical spine: Normal marrow signal.

Sinuses/Orbits: There is complete opacification of the left
maxillary sinus and bilateral frontal sinuses. Associated
hyperostosis was seen on the CT.

Other: A focus of diffusion restriction in the right parotid gland
likely reflects an intraparotid lymph node. There is trace fluid in
the mastoid air cells.
IMPRESSION: 1. Subacute infarct in the right lentiform nucleus extending to the
caudate body.
2. Chronic left maxillary and bilateral frontal sinusitis.

## 2021-03-12 IMAGING — CR DG CHEST 2V
2 series · 2 of 2 positions shown · non-contrast
Comparison: Report from study [DATE].  Images not available.

CLINICAL DATA: Dizziness, chest pain

EXAM:
CHEST - 2 VIEW

[chest pa]
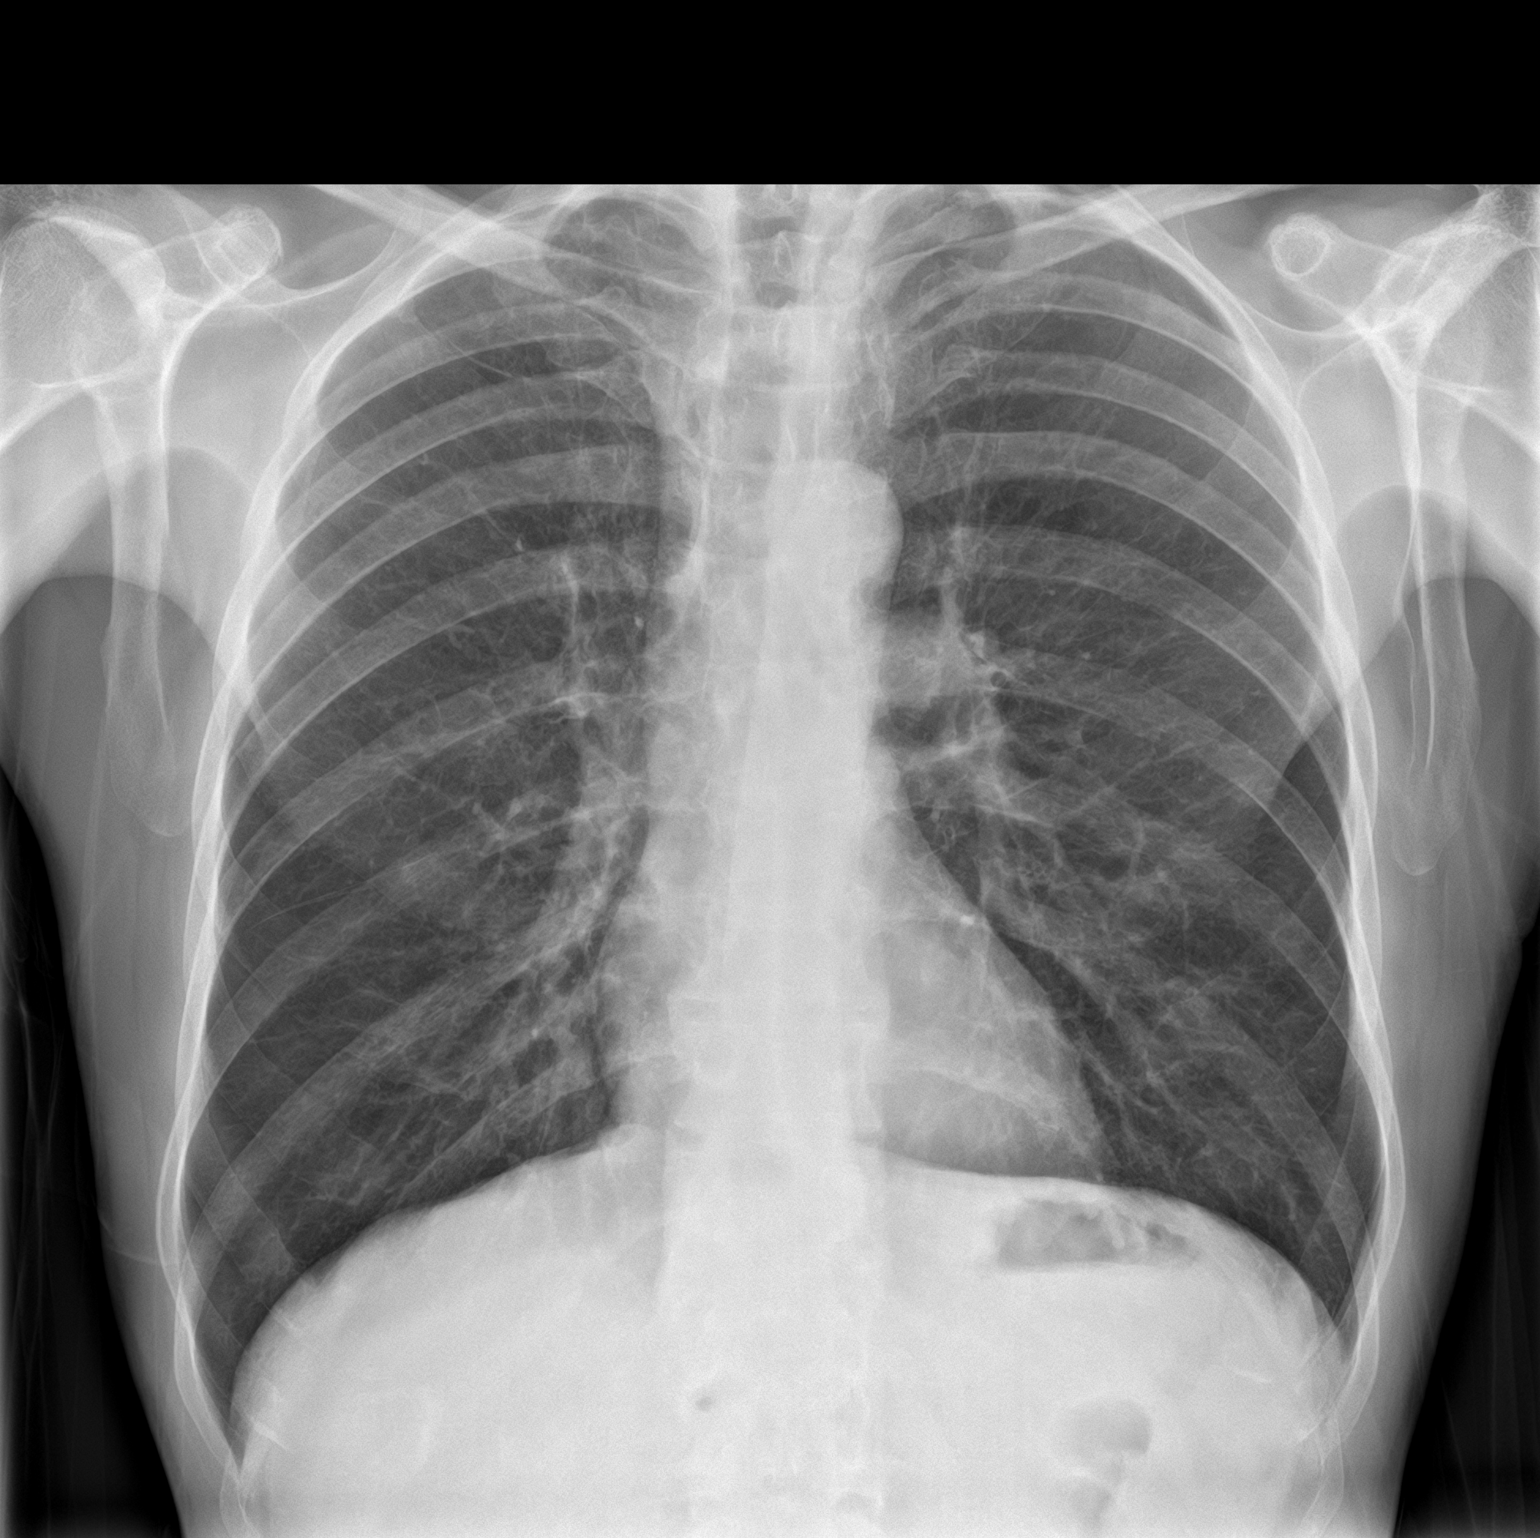

[chest lat]
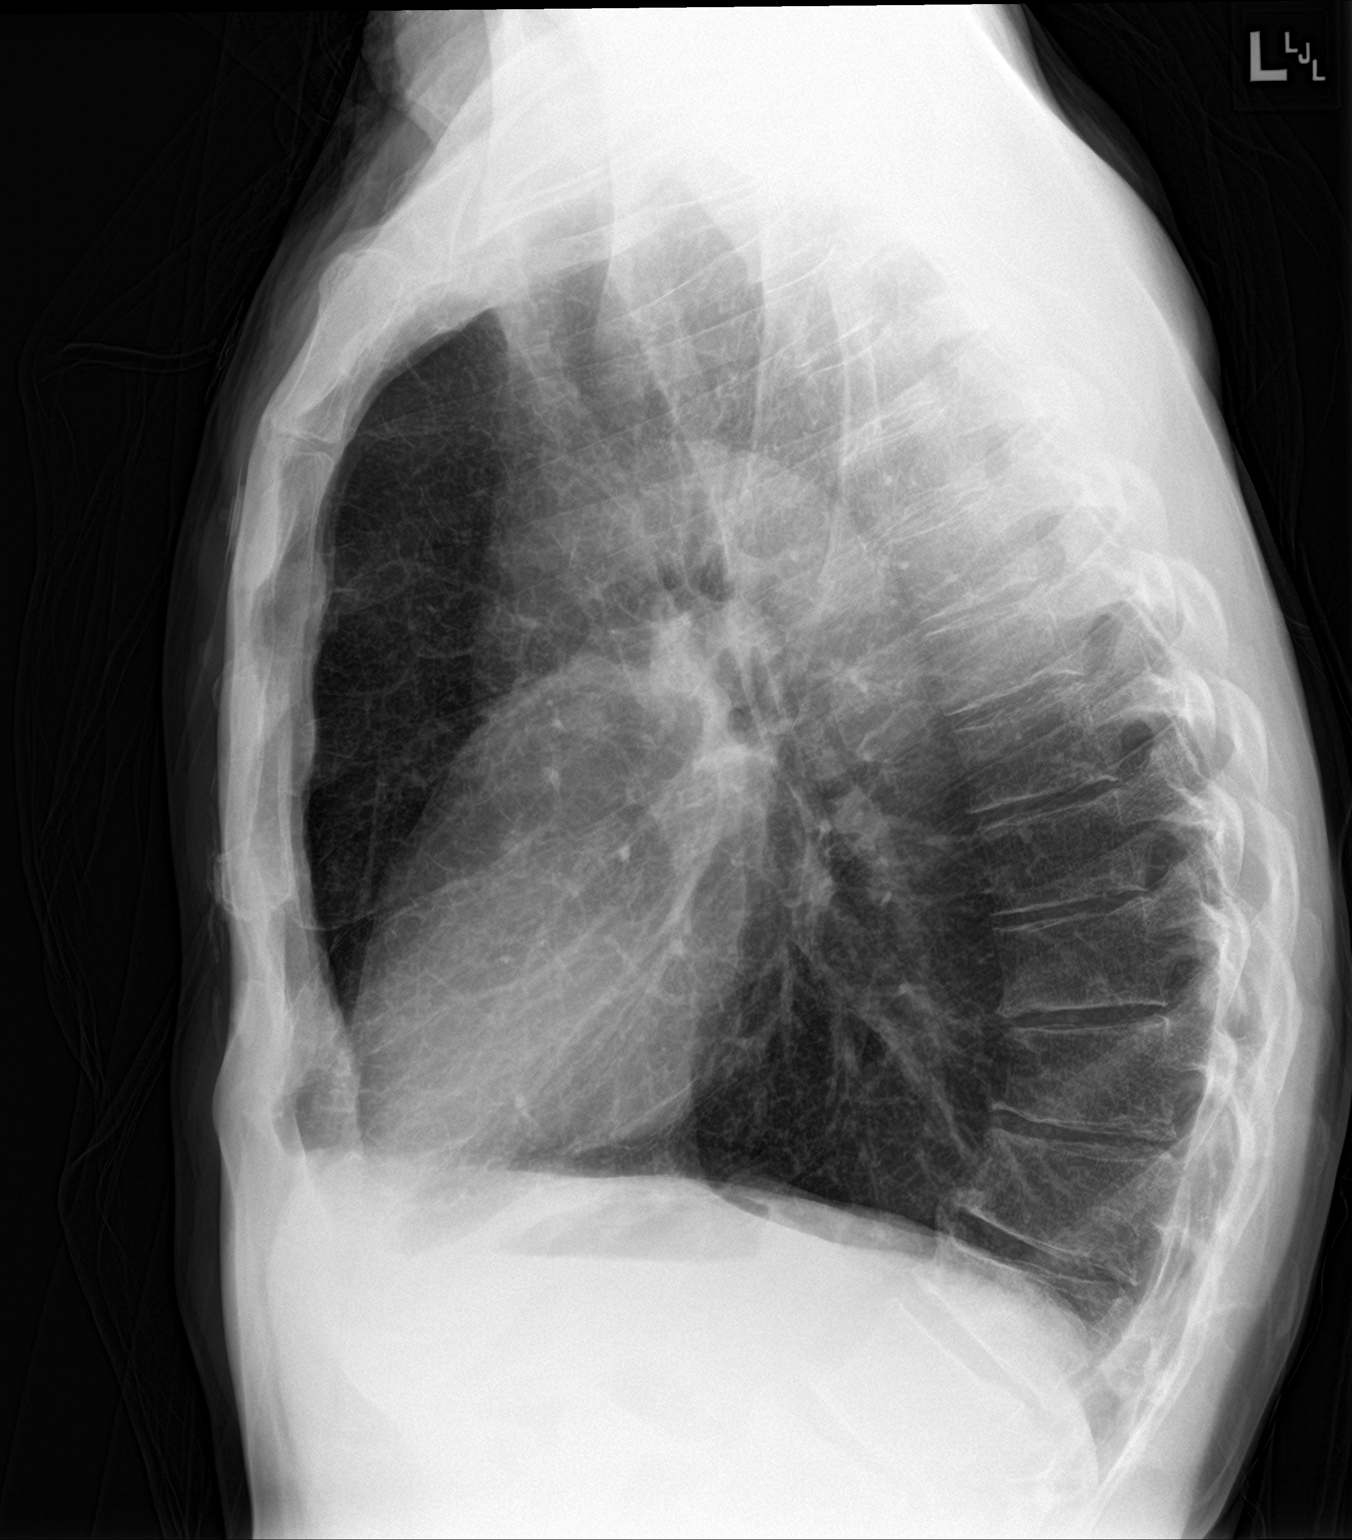

[2 of 2 positions shown; findings below may reference images not displayed]

FINDINGS: Heart and mediastinal contours are within normal limits. No focal
opacities or effusions. No acute bony abnormality. Chronic
compression deformity in the lower thoracic spine, described on
prior study.
IMPRESSION: No active cardiopulmonary disease.

## 2021-03-12 IMAGING — CT CT ANGIO HEAD-NECK (W OR W/O PERF)
2 of 7 series · 8 of 33 positions shown · IV contrast (APPLIED)
Comparison: Same day MRI and CT head.

CLINICAL DATA: Stroke/TIA, assess extracranial arteries Stroke/TIA,
assess intracranial arteries

EXAM:
CT ANGIOGRAPHY HEAD AND NECK
TECHNIQUE: Multidetector CT imaging of the head and neck was performed using
the standard protocol during bolus administration of intravenous
contrast. Multiplanar CT image reconstructions and MIPs were
obtained to evaluate the vascular anatomy. Carotid stenosis
measurements (when applicable) are obtained utilizing NASCET
criteria, using the distal internal carotid diameter as the
denominator.
CONTRAST:  75mL OMNIPAQUE IOHEXOL 350 MG/ML SOLN

[Series 4: cta head neck · axial · 0.50mm/px · z∈[+320,+448]mm · 2 of 190 slices shown]
[im 64/190  soft-tissue]
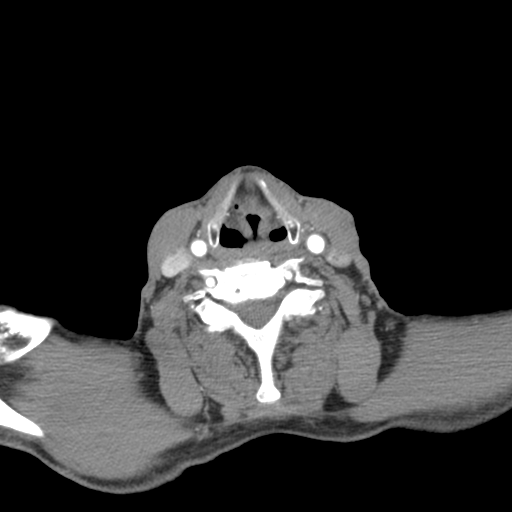
[im 127/190  bone]
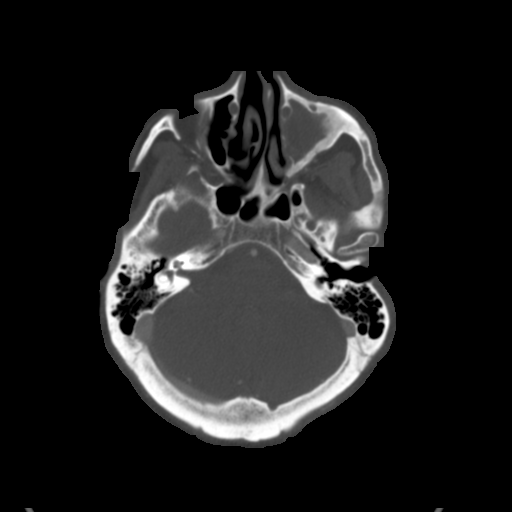

[Series 6: ax thin · axial · 0.49mm/px · z∈[+242,+512]mm · 6 of 373 slices shown]
[im 54/373  soft-tissue]
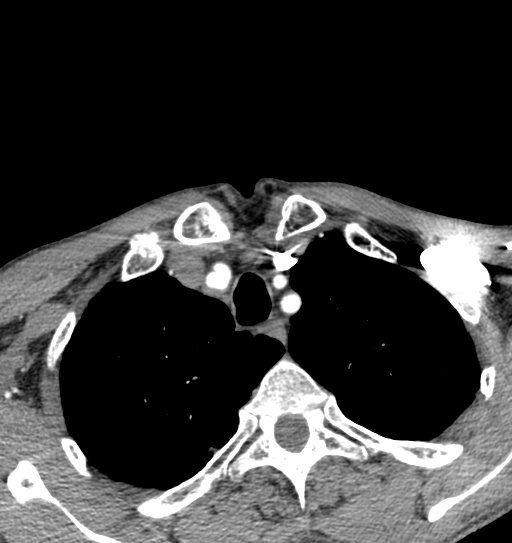
[im 107/373  soft-tissue]
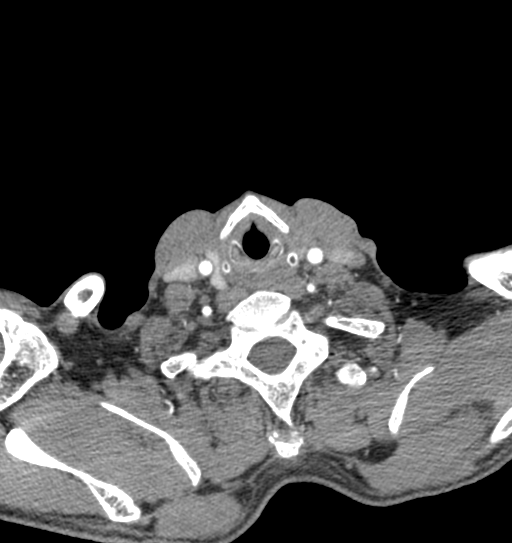
[im 160/373  soft-tissue]
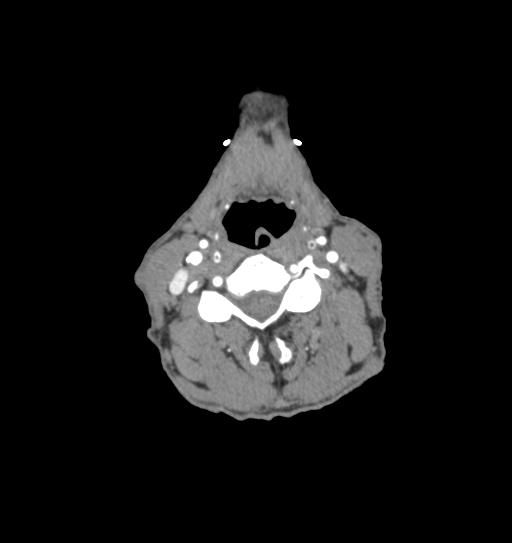
[im 213/373  soft-tissue]
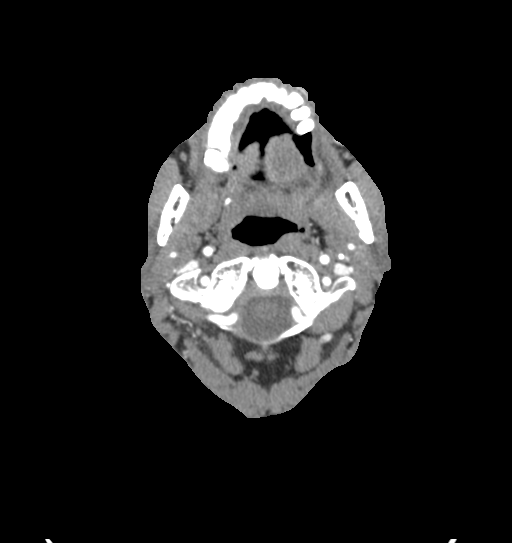
[im 266/373  soft-tissue]
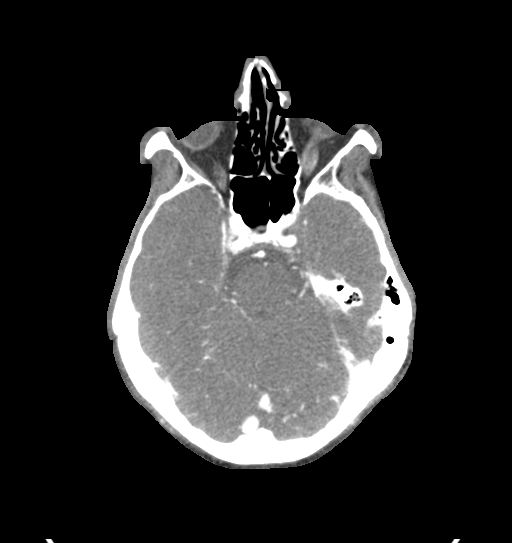
[im 319/373  soft-tissue]
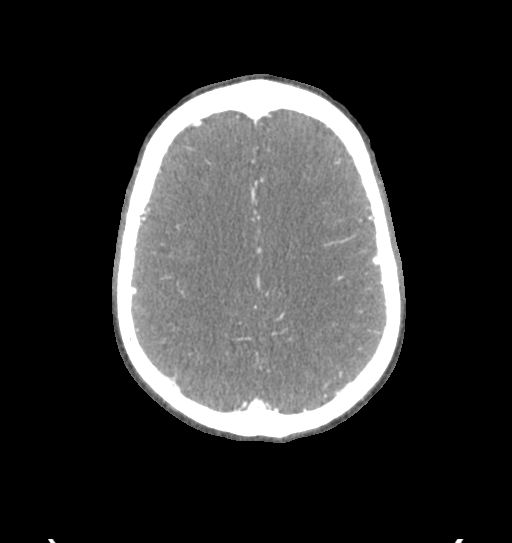

[8 of 33 positions shown; findings below may reference images not displayed]

FINDINGS: CTA NECK FINDINGS

Aortic arch: Great vessel origins are patent.

Right carotid system: Mild atherosclerosis at the carotid
bifurcation without greater than 50% stenosis.

Left carotid system: Mild atherosclerosis at the carotid bifurcation
without greater than 50% stenosis.

Vertebral arteries: Right dominant. No significant (greater than
50%) stenosis.

Skeleton: Moderate degenerative disease at C4-C5, C5-C6 and C6-C7
with disc height loss, endplate sclerosis and posterior disc
osteophyte complexes.

Other neck: No acute abnormality.

Upper chest: Emphysema and pleuroparenchymal scarring and bilateral
visualized apices. Multiple 3-4 mm nodular lesions in the right
upper lobe, which could represent scarring or nodules. The largest
measures 4 mm on series 6, image 70).

Review of the MIP images confirms the above findings

CTA HEAD FINDINGS

Anterior circulation: Bilateral intracranial ICAs, MCAs, and ACAs
are patent without proximal hemodynamically significant stenosis. No
aneurysm identified.

Posterior circulation: Bilateral intradural vertebral arteries,
basilar artery, and posterior cerebral arteries are patent without
proximal hemodynamically significant stenosis. Right posterior
communicating artery. No aneurysm identified.

Venous sinuses: As permitted by contrast timing, patent.

Anatomic variants: See above.

Review of the MIP images confirms the above findings
IMPRESSION: 1. No large vessel occlusion or proximal hemodynamically significant
stenosis in the head or neck.
2. Multiple 3-4 mm pulmonary nodules versus scarring in the right
lung apex. Given the patient's risk factors (smoking and emphysema),
non-contrast chest CT is recommended in 12 months. This
recommendation follows the consensus statement: Guidelines for
Management of Incidental Pulmonary Nodules Detected on CT Images:

## 2021-03-12 IMAGING — CT CT HEAD W/O CM
3 series · 16 of 47 positions shown, 19 images · non-contrast
Comparison: [DATE]

CLINICAL DATA: TIA, dizziness

EXAM:
CT HEAD WITHOUT CONTRAST
TECHNIQUE: Contiguous axial images were obtained from the base of the skull
through the vertex without intravenous contrast.

[Series 2: head wo · axial · 0.45mm/px · z∈[-72,+63]mm · 10 of 33 slices shown, 13 images]
[im 3/33  brain]
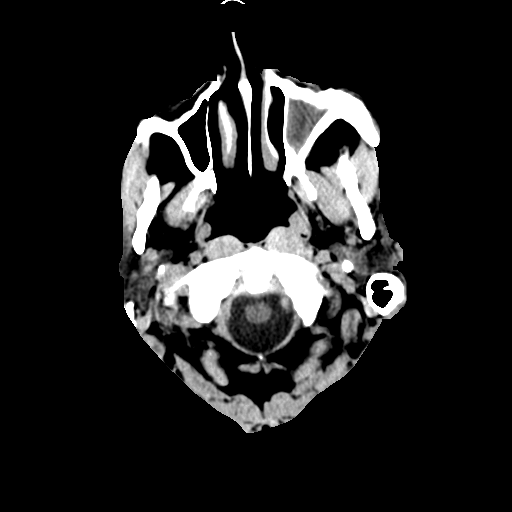
[im 3/33  bone]
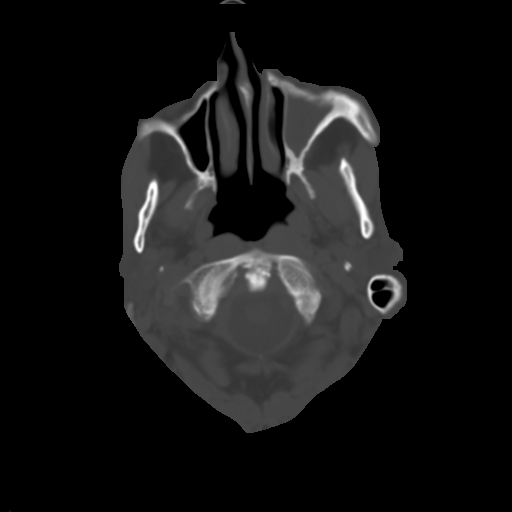
[im 6/33  brain]
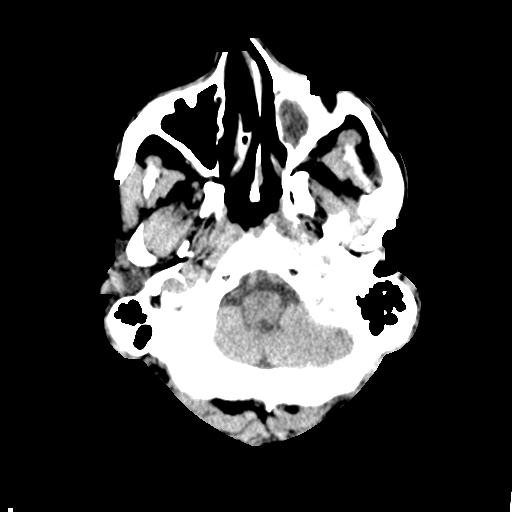
[im 9/33  brain]
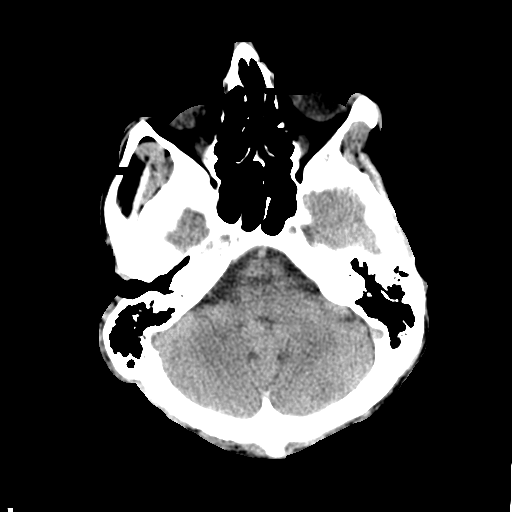
[im 12/33  brain]
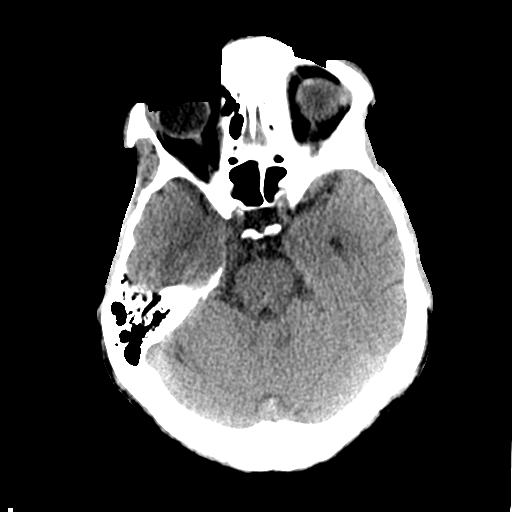
[im 15/33  brain]
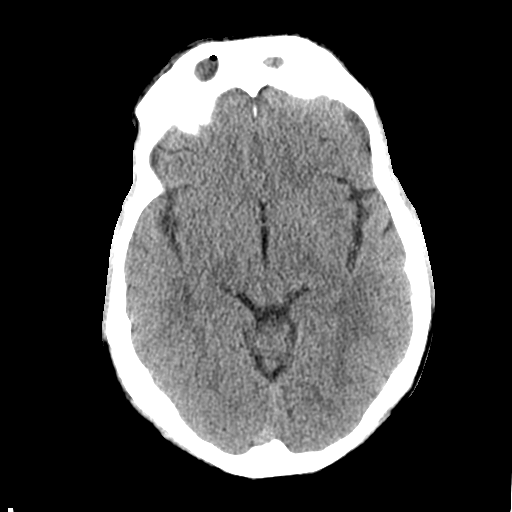
[im 15/33  bone]
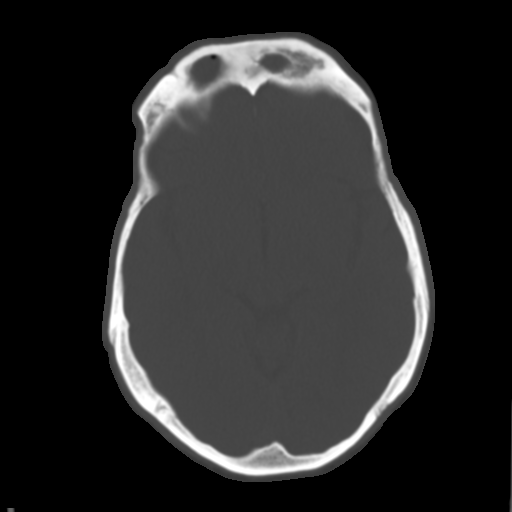
[im 18/33  brain]
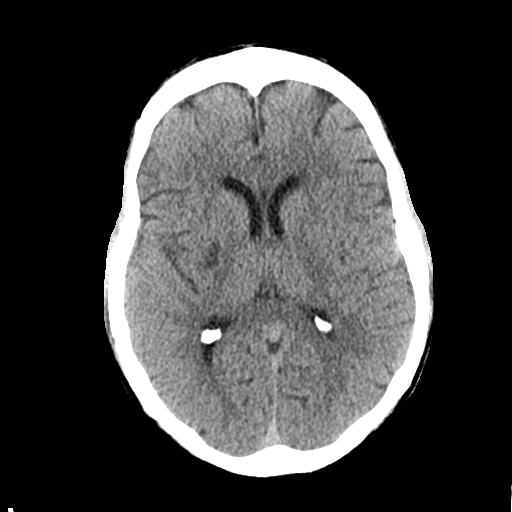
[im 21/33  brain]
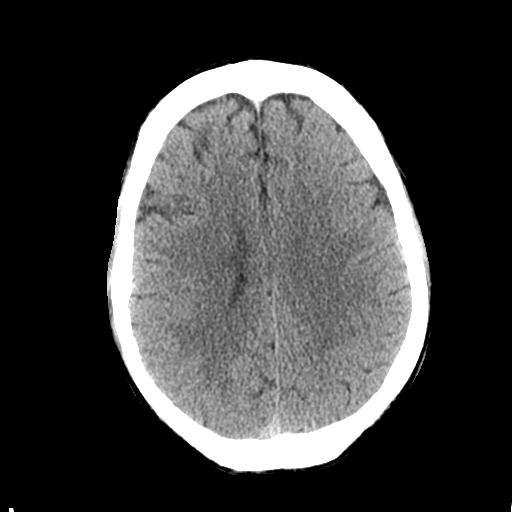
[im 25/33  brain]
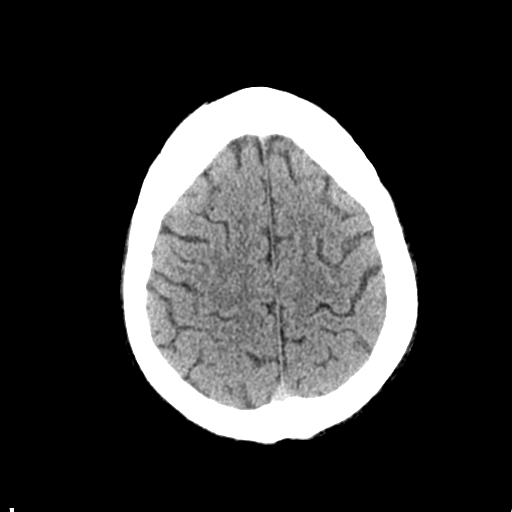
[im 27/33  brain]
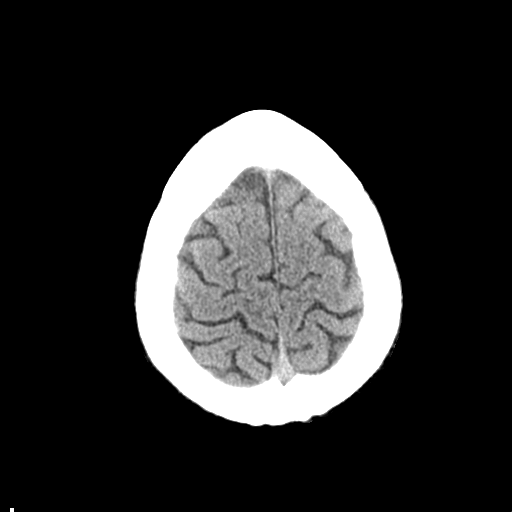
[im 27/33  bone]
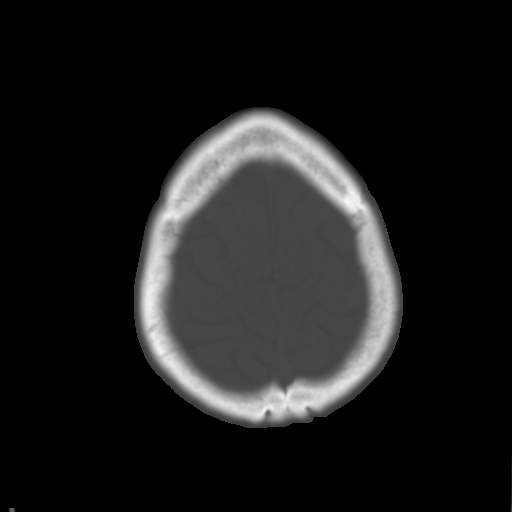
[im 30/33  brain]
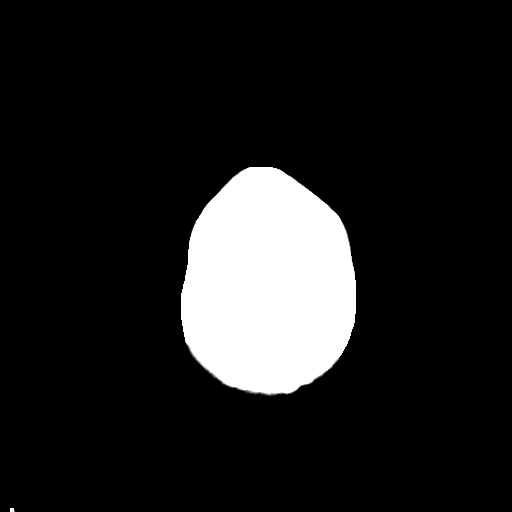

[Series 4: coronal soft tissue · coronal · 0.34mm/px · 3 of 73 slices shown]
[im 25/73  brain]
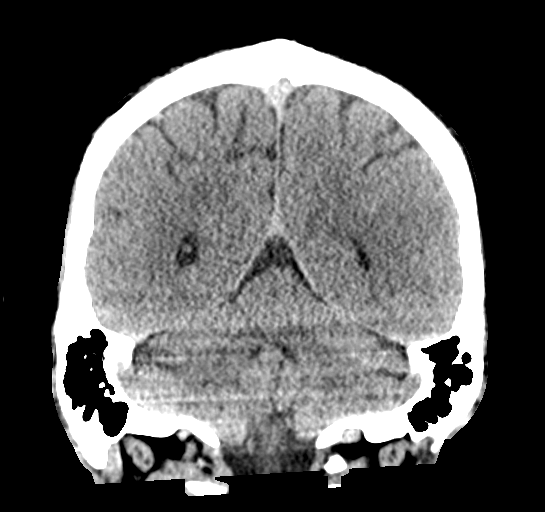
[im 33/73  brain]
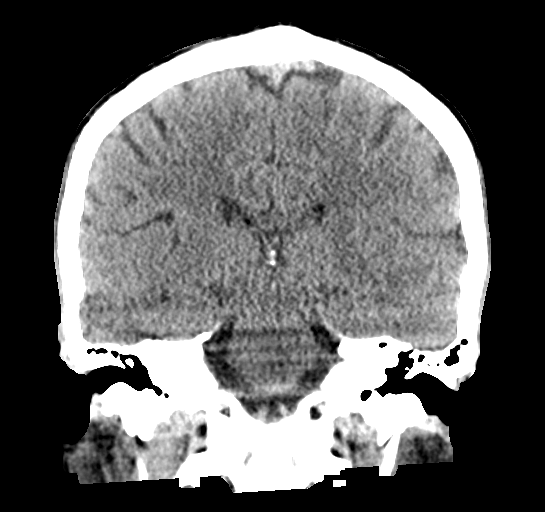
[im 41/73  brain]
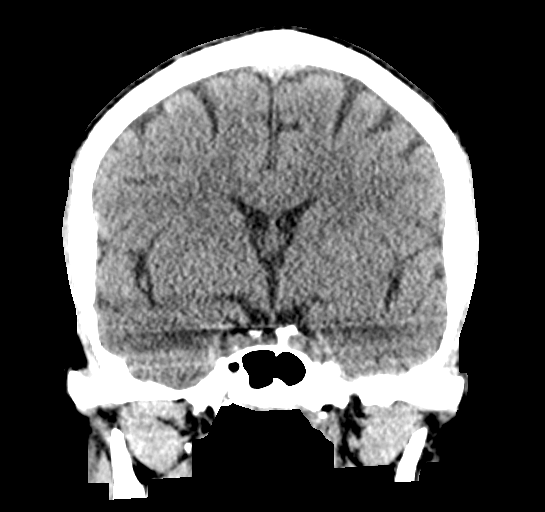

[Series 5: sagittal soft tissue · sagittal · 0.33mm/px · 3 of 62 slices shown]
[im 21/62  brain]
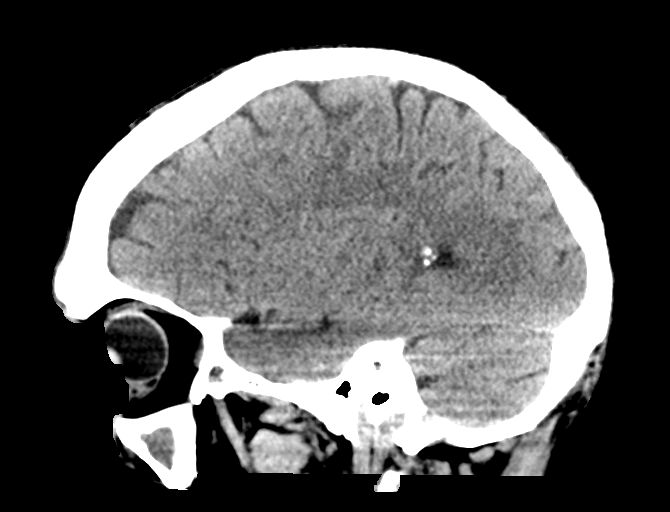
[im 31/62  brain]
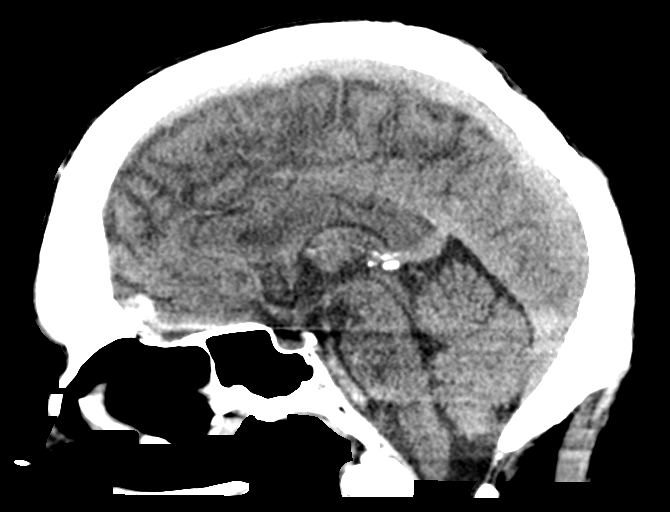
[im 41/62  brain]
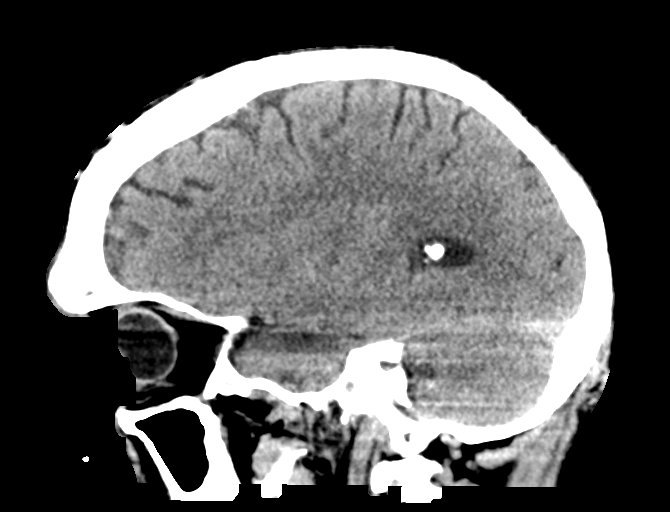

[16 of 47 positions shown; findings below may reference images not displayed]

FINDINGS: Brain: Low-density noted in the right basal ganglia compatible with
age-indeterminate lacunar infarct. No additional infarct. No
hemorrhage or hydrocephalus. No mass effect or midline shift.

Vascular: No hyperdense vessel or unexpected calcification.

Skull: No acute calvarial abnormality.

Sinuses/Orbits: Complete opacification of the frontal sinuses and
upper ethmoid air cells. No air-fluid levels. Mastoid air cells
clear.

Other: None
IMPRESSION: Age-indeterminate right basal ganglia lacunar infarct. This can be
further evaluated with MRI if felt clinically indicated.

Chronic sinusitis.

## 2021-03-12 MED ORDER — ASPIRIN EC 325 MG PO TBEC
325.0000 mg | DELAYED_RELEASE_TABLET | Freq: Once | ORAL | Status: AC
  Administered 2021-03-12: 325 mg via ORAL
  Filled 2021-03-12: qty 1

## 2021-03-12 MED ORDER — ACETAMINOPHEN 325 MG RE SUPP: 650.0000 mg | RECTAL | Status: DC | PRN

## 2021-03-12 MED ORDER — ASPIRIN 81 MG PO CHEW
81.0000 mg | CHEWABLE_TABLET | Freq: Every day | ORAL | Status: DC
  Administered 2021-03-13: 81 mg via ORAL
  Filled 2021-03-12: qty 1

## 2021-03-12 MED ORDER — ACETAMINOPHEN 160 MG/5ML PO SOLN
650.0000 mg | ORAL | Status: DC | PRN
  Filled 2021-03-12: qty 20.3

## 2021-03-12 MED ORDER — ACETAMINOPHEN 325 MG PO TABS: 650.0000 mg | ORAL_TABLET | ORAL | Status: DC | PRN

## 2021-03-12 MED ORDER — HYDRALAZINE HCL 10 MG PO TABS
10.0000 mg | ORAL_TABLET | Freq: Four times a day (QID) | ORAL | Status: DC | PRN
  Filled 2021-03-12: qty 1

## 2021-03-12 MED ORDER — NICOTINE 21 MG/24HR TD PT24
21.0000 mg | MEDICATED_PATCH | Freq: Every day | TRANSDERMAL | Status: DC
  Administered 2021-03-13: 21 mg via TRANSDERMAL
  Filled 2021-03-12: qty 1

## 2021-03-12 MED ORDER — STROKE: EARLY STAGES OF RECOVERY BOOK: Freq: Once | Status: DC

## 2021-03-12 MED ORDER — GADOBUTROL 1 MMOL/ML IV SOLN
6.0000 mL | Freq: Once | INTRAVENOUS | Status: AC | PRN
  Administered 2021-03-12: 6 mL via INTRAVENOUS
  Filled 2021-03-12: qty 6

## 2021-03-12 MED ORDER — IOHEXOL 350 MG/ML SOLN
75.0000 mL | Freq: Once | INTRAVENOUS | Status: AC | PRN
  Administered 2021-03-12: 75 mL via INTRAVENOUS
  Filled 2021-03-12: qty 75

## 2021-03-12 MED ORDER — NICOTINE 21 MG/24HR TD PT24
21.0000 mg | MEDICATED_PATCH | Freq: Once | TRANSDERMAL | Status: AC
  Administered 2021-03-12: 21 mg via TRANSDERMAL
  Filled 2021-03-12: qty 1

## 2021-03-12 NOTE — Plan of Care (Addendum)
MRI shows acute/subacute infarct. While this does not explain generalized weakness or dizziness, patient will need admission for stroke workup.   # Right lacunar stroke, etiology embolic given size vs. small vessel disease given location - Stroke labs TSH, ESR, RPR, HgbA1c, fasting lipid panel - MRI brain completed as above - CTA (ordered) - Frequent neuro checks per stroke protocol - Echocardiogram - Prophylactic therapy-Antiplatelet med: Aspirin - dose 370m PO or 3087mPR, followed by 81 mg daily - Risk factor modification - Telemetry monitoring - Blood pressure goal   - Normotension as patient is 5 days out from symptom onset; may be amended pending vessel imaging - PT consult, OT consult, Speech consult   Addendum:  CTA head and neck personally reviewed, agree with radiology read 1. No large vessel occlusion or proximal hemodynamically significant stenosis in the head or neck. 2. Multiple 3-4 mm pulmonary nodules versus scarring in the right lung apex. Given the patient's risk factors (smoking and emphysema), non-contrast chest CT is recommended in 12 months. This recommendation follows the consensus statement: Guidelines for Management of Incidental Pulmonary Nodules Detected on CT Images: From the Fleischner Society 2017; Radiology 2017; 284:228-243.  [facility] and I did not want to delay the CTA study given concern for potential vertebrobasilar insufficiency.  While his scan did not reveal any significant vessel stenoses, it did reveal pulmonary nodules.  Stroke risk can be highly elevated in the setting of malignancy due to hypercoagulable state, therefore we will order his CT chest on an inpatient basis for expedited work-up of this possibility.  In our brief interaction, he did report that the weakness was episodic and would come out of nowhere while he was standing up working.  Given  his significant smoking history as well as the nodules on chest CT, appreciate primary team's evaluation for possible PE or other etiology of his episodic dizziness.  Additional recommendations: - CT chest without contrast - Hold off on Plavix for now in case biopsy or other work-up is needed for pulmonary nodule findings; note the majority of the benefit from dual antiplatelet therapy is within the first few days and he is reportedly 5 days from symptom onset - Please consider PE work-up or other work-up for episodic dizziness per primary team - Neurology to follow (Dr. StQuinn Axeomorrow) for full consultation   SrLesleigh NoeD-PhD [facility] [phone] coverage [facility] from 8 AM to 4 AM in-house and 4 PM to 8 PM by telephone/video. 8 PM to 8 AM emergent questions or overnight urgent questions should be addressed to Teleneurology On-call or [facility]; contact information can be found on AMION

## 2021-03-12 NOTE — ED Triage Notes (Signed)
Pt to ED via POV, pt states that since Thursday he has been having dizziness. Pt reports that he was seen in the walk in clinic on Sunday and he was told that he was dehydrated. Pt states that he has been increasing his fluids but when he went to work last night he felt like he was going to pass out and he had to leave work. Pt states that he does not have any taste, pt reports that he was tested for COVID and it was negative. Pt is in NAD.

## 2021-03-12 NOTE — ED Provider Notes (Signed)
I sent care of this patient approximately 1500.  Please eval for environment for full details regarding patient's initial evaluation assessment.  In brief patient presents for several days of dizziness.  CT head is concerning for possible subacute CVA.  After discussing with neurology plan is to obtain MRI brain.  MRI is concerning for subacute stroke.  Patient mid to hospitalist service for further evaluation management.  Neurology aware.   Gilles Chiquito, MD 03/12/21 [phone]

## 2021-03-12 NOTE — ED Provider Notes (Signed)
Emergency Medicine Provider Triage Evaluation Note  Nicholas Lang , a 60 y.o. male  was evaluated in triage.  Pt complains of dizziness, nearly syncope, sweating at the time, loss of taste, negative COVID test at urgent care.  Review of Systems  Positive: Dizziness, near syncope, diaphoresis Negative: No abdominal pain, nausea/vomiting  Physical Exam  BP 133/90 (BP Location: Left Arm)   Pulse 63   Temp 98.2 F (36.8 C) (Oral)   Resp 16   Ht 6' (1.829 m)   Wt 65.3 kg   SpO2 95%   BMI 19.53 kg/m  Gen:   Awake, no distress   Resp:  Normal effort  MSK:   Moves extremities without difficulty  Other:    Medical Decision Making  Medically screening exam initiated at 10:00 AM.  Appropriate orders placed.  Nicholas Lang was informed that the remainder of the evaluation will be completed by another provider, this initial triage assessment does not replace that evaluation, and the importance of remaining in the ED until their evaluation is complete.  Complaints of dizziness.  Seen at urgent care and told it was due to dehydration.  Referred to cardiologist.  Symptoms have continued since last Thursday   Nicholas Lang, Cordelia Poche 03/12/21 1001    Chesley Noon, MD 03/12/21 1455

## 2021-03-12 NOTE — H&P (Signed)
Lung History and Physical   Nicholas Lang GYI:[id number] DOB: 01-18-1961 DOA: 03/12/2021  PCP: Patient, No Pcp Per (Inactive)  Outpatient Specialists: None Patient coming from: Home  I have personally briefly reviewed patient's old medical records in [facility] EMR.  Chief Concern: Weakness and dizziness  HPI: Nicholas Lang is a 60 y.o. male with medical history significant for Nicholas Lang,   He was at work when he felt weak.   This started Wednesday evening, dizziness, weakness, diaphoresis. This happened on Thursday night as well. Since Thursday, he felt like he was drunk while driving. He denies dysphagia, chest pain, s shortness of breath. He lost his ability to taste on Thursday.   He denies nausea, vomiting.   He denies difficulty walking. He has been experiencing drained enegery while standing for long periods. He states that at baseline he can stand and walking long periods witout difficult. He endroses two days of sleeping. He slept from Thursday night to Saturday.   He deneis this happenign before. He flet like he had slurred speech Thursday night. He called ambulance and EMS evaluated him and said that he might have had covid.   He denies dysuria, hematuria, syncope, lost of consciousness.   He works third shift, Control and instrumentation engineer.   Social history: He smokes 1 ppd, started when he was 15. At his peak, he smoked 3 ppds. He has not had any etoh for 4 years. He formerly used to drink 12 beers per day. He formerly used cocaine when he was in his twenties and thirties  Vaccination history: He is not vaccinated for covid 19.   ROS: Constitutional: no weight change, no fever ENT/Mouth: no sore throat, no rhinorrhea Eyes: no eye pain, no vision changes Cardiovascular: no chest pain, no dyspnea,  no edema, no palpitations Respiratory: no cough, no sputum, no wheezing Gastrointestinal: no nausea, no vomiting, no diarrhea, no constipation Genitourinary: no urinary incontinence,  no dysuria, no hematuria Musculoskeletal: no arthralgias, no myalgias Skin: no skin lesions, no pruritus, Neuro: + weakness, no loss of consciousness, no syncope Psych: no anxiety, no depression, no decrease appetite Heme/Lymph: no bruising, no bleeding  ED Course: Discussed with EDP, patient requiring hospitalization for chief concerns of stroke.  Vitals in the emergency department was remarkable for temperature 98.2, respiration rate of 16, heart rate 63, blood pressure 133/90, SPO2 of 95% on room air.  Labs in the emergency department was unremarkable.  High-sensitivity troponin was 3.  COVID PCR/influenza A/influenza B PCR were negative.  MRI of the head showed a right basal ganglia lacunar infarct  Assessment/Plan  Principal Problem:   Stroke [facility]) Active Problems:   Tobacco dependence   Encounter for tobacco use cessation counseling   Nodule of apex of right lung   # Right lacunar stroke-given patient's history of tobacco cigarette dependence, I suspect this is secondary to small vessel disease - CT head without contrast read as: Age indeterminant right basal ganglia lacunar infarct. - Neurology has been consulted and we appreciate further recommendations - Complete echo - MRI of the brain -Further imaging recommended per neurology - Fasting lipid and A1c ordered - Check B12 - Permissive hypertension per neurology recommendations - Frequent neuro vascular checks - N.p.o. pending swallow study - PT, OT, SLP - Fall precaution and aspiration precaution  # Tobacco abuse-nicotine patch daily ordered - Patient endorses readiness to stop tobacco use.  # Tobacco cessation counseling:  Week one, smoke 19 cigarettes per day. Week two, smoke 18 cigarettes per day.  Week three, smoke 17 cigarettes per day, continue until smoking half the amount of cigarettes per day Discuss with PCP for pharmacologic assistance with smoking cessation Clean all indoor clothing, sheets, blankets,  and freshen textile furniture to rid the smell of cigarettes Only smoke outside and wear outer covering Leave cigarettes and lighters outside in separate places During in-between cigarettes, if you feel the urge to smoke, use the following: stress squeezing devices/phone a trusted friend to talk you through the urge/walk in a safe environment Avoid prolong interactions with individuals actively smoking cigarettes Avoid social setting where cigarette smoking is expected or considered acceptable  Call 1800 QUIT NOW if in need of nicotine patches to help with cessation  # Right lung apex with multiple 3 to 4 mm pulmonary nodules-recommend patient follow-up with PCP for outpatient CT noncontrast imaging in 12 months - Explained to patient the CT findings and extensively discussed with patient at bedside to get a PCP and to have a scheduled CT in 12 months to evaluate for possible changes in the nodules - Patient endorses understanding and compliance and states that he will try to remember this - Attempted to call spouse and left a voicemail.  There was no pickup  Chart reviewed.   DVT prophylaxis: TED hose Code Status: Full code Diet: Heart healthy Family Communication: Called Nicholas Lang, spouse at [phone], no pickup; attempted to call [phone], no pickup Disposition Plan: Pending clinical course Consults called: Neurology Admission status: MedSurg, observation, telemetry  History reviewed. No pertinent past medical history.  History reviewed. No pertinent surgical history.  Social History:  reports that he has been smoking cigarettes. He has never used smokeless tobacco. He reports that he does not currently use alcohol. He reports that he does not currently use drugs.  No Known Allergies History reviewed. No pertinent family history. Family history: Family history reviewed and not pertinent  Prior to Admission medications   Patient currently not taking any outpatient  medications including over-the-counter   Physical Exam: Vitals:   03/12/21 0954 03/12/21 0957 03/12/21 1449 03/12/21 1828  BP:  133/90 127/86 136/89  Pulse:  63 61 63  Resp:  16 15 18   Temp:  98.2 F (36.8 C)    TempSrc:  Oral    SpO2:  95% 98% 98%  Weight: 65.3 kg     Height: 6' (1.829 m)      Constitutional: appears older than chronological age, NAD, calm, comfortable Eyes: PERRL, lids and conjunctivae normal ENMT: Mucous membranes are moist. Posterior pharynx clear of any exudate or lesions. Age-appropriate dentition.  Mild to moderate hearing loss Neck: normal, supple, no masses, no thyromegaly Respiratory: clear to auscultation bilaterally, no wheezing, no crackles. Normal respiratory effort. No accessory muscle use.  Cardiovascular: Regular rate and rhythm, no murmurs / rubs / gallops. No extremity edema. 2+ pedal pulses. No carotid bruits.  Abdomen: no tenderness, no masses palpated, no hepatosplenomegaly. Bowel sounds positive.  Musculoskeletal: no clubbing / cyanosis. No joint deformity upper and lower extremities. Good ROM, no contractures, no atrophy. Normal muscle tone.  Skin: no rashes, lesions, ulcers. No induration Neurologic: Sensation intact. Strength 5/5 in all 4.  Psychiatric: Normal judgment and insight. Alert and oriented x 3. Normal mood.   EKG: independently reviewed, showing sinus bradycardia with rate of 58, QTc 382  Chest x-ray on Admission: I personally reviewed and I agree with radiologist reading as below.  CT ANGIO HEAD NECK W WO CM  Result Date: 03/12/2021 CLINICAL DATA:  Stroke/TIA,  assess extracranial arteries Stroke/TIA, assess intracranial arteries EXAM: CT ANGIOGRAPHY HEAD AND NECK TECHNIQUE: Multidetector CT imaging of the head and neck was performed using the standard protocol during bolus administration of intravenous contrast. Multiplanar CT image reconstructions and MIPs were obtained to evaluate the vascular anatomy. Carotid stenosis  measurements (when applicable) are obtained utilizing NASCET criteria, using the distal internal carotid diameter as the denominator. CONTRAST:  23mL OMNIPAQUE IOHEXOL 350 MG/ML SOLN COMPARISON:  Same day MRI and CT head. FINDINGS: CTA NECK FINDINGS Aortic arch: Great vessel origins are patent. Right carotid system: Mild atherosclerosis at the carotid bifurcation without greater than 50% stenosis. Left carotid system: Mild atherosclerosis at the carotid bifurcation without greater than 50% stenosis. Vertebral arteries: Right dominant. No significant (greater than 50%) stenosis. Skeleton: Moderate degenerative disease at C4-C5, C5-C6 and C6-C7 with disc height loss, endplate sclerosis and posterior disc osteophyte complexes. Other neck: No acute abnormality. Upper chest: Emphysema and pleuroparenchymal scarring and bilateral visualized apices. Multiple 3-4 mm nodular lesions in the right upper lobe, which could represent scarring or nodules. The largest measures 4 mm on series 6, image 70). Review of the MIP images confirms the above findings CTA HEAD FINDINGS Anterior circulation: Bilateral intracranial ICAs, MCAs, and ACAs are patent without proximal hemodynamically significant stenosis. No aneurysm identified. Posterior circulation: Bilateral intradural vertebral arteries, basilar artery, and posterior cerebral arteries are patent without proximal hemodynamically significant stenosis. Right posterior communicating artery. No aneurysm identified. Venous sinuses: As permitted by contrast timing, patent. Anatomic variants: See above. Review of the MIP images confirms the above findings IMPRESSION: 1. No large vessel occlusion or proximal hemodynamically significant stenosis in the head or neck. 2. Multiple 3-4 mm pulmonary nodules versus scarring in the right lung apex. Given the patient's risk factors (smoking and emphysema), non-contrast chest CT is recommended in 12 months. This recommendation follows the  consensus statement: Guidelines for Management of Incidental Pulmonary Nodules Detected on CT Images: From the Fleischner Society 2017; Radiology 2017; 284:228-243. Electronically Signed   By: Feliberto Harts M.D.   On: 03/12/2021 16:14   DG Chest 2 View  Result Date: 03/12/2021 CLINICAL DATA:  Dizziness, chest pain EXAM: CHEST - 2 VIEW COMPARISON:  Report from study 09/13/2018.  Images not available. FINDINGS: Heart and mediastinal contours are within normal limits. No focal opacities or effusions. No acute bony abnormality. Chronic compression deformity in the lower thoracic spine, described on prior study. IMPRESSION: No active cardiopulmonary disease. Electronically Signed   By: Charlett Nose M.D.   On: 03/12/2021 10:52   CT HEAD WO CONTRAST ( )  Result Date: 03/12/2021 CLINICAL DATA:  TIA, dizziness EXAM: CT HEAD WITHOUT CONTRAST TECHNIQUE: Contiguous axial images were obtained from the base of the skull through the vertex without intravenous contrast. COMPARISON:  06/10/2011 FINDINGS: Brain: Low-density noted in the right basal ganglia compatible with age-indeterminate lacunar infarct. No additional infarct. No hemorrhage or hydrocephalus. No mass effect or midline shift. Vascular: No hyperdense vessel or unexpected calcification. Skull: No acute calvarial abnormality. Sinuses/Orbits: Complete opacification of the frontal sinuses and upper ethmoid air cells. No air-fluid levels. Mastoid air cells clear. Other: None IMPRESSION: Age-indeterminate right basal ganglia lacunar infarct. This can be further evaluated with MRI if felt clinically indicated. Chronic sinusitis. Electronically Signed   By: Charlett Nose M.D.   On: 03/12/2021 10:51   CT CHEST WO CONTRAST  Result Date: 03/12/2021 CLINICAL DATA:  60 year old male with history of pulmonary nodules. EXAM: CT CHEST WITHOUT CONTRAST TECHNIQUE: Multidetector CT imaging  of the chest was performed following the standard protocol without IV contrast.  COMPARISON:  No priors. FINDINGS: Cardiovascular: Heart size is normal. There is no significant pericardial fluid, thickening or pericardial calcification. There is aortic atherosclerosis, as well as atherosclerosis of the great vessels of the mediastinum and the coronary arteries, including calcified atherosclerotic plaque in the left anterior descending coronary artery. Mediastinum/Nodes: No pathologically enlarged mediastinal or hilar lymph nodes. Please note that accurate exclusion of hilar adenopathy is limited on noncontrast CT scans. Esophagus is unremarkable in appearance. No axillary lymphadenopathy. Lungs/Pleura: Extensive pleuroparenchymal thickening and nodular architectural distortion in the apices of both lungs, most likely to reflect areas of chronic post infectious or inflammatory scarring. Several scattered tiny 2-5 mm pulmonary nodules are noted elsewhere throughout the lungs, largest of which is in the posterior aspect of the right lower lobe near the base (axial image 142 of series 3) measuring 5 mm, nonspecific, but statistically likely benign. No larger more suspicious appearing pulmonary nodules or masses are noted. No acute consolidative airspace disease. No pleural effusions. Diffuse bronchial wall thickening with moderate centrilobular and paraseptal emphysema. Upper Abdomen: Aortic atherosclerosis. Iodinated contrast material noted in the collecting systems of both kidneys related to recent contrast enhanced CT angio of the head and neck. Musculoskeletal: Chronic compression fracture of L1 with 60% loss of anterior vertebral body height. Multiple Schmorl's nodes are incidentally noted. There are no aggressive appearing lytic or blastic lesions noted in the visualized portions of the skeleton. IMPRESSION: 1. The findings on the recent CTA of the head neck likely reflect areas of chronic post infectious or inflammatory scarring in the lung apices. There are multiple small 2-5 mm pulmonary  nodules noted elsewhere in the lungs bilaterally, nonspecific, but statistically likely benign. No follow-up needed if patient is low-risk (and has no known or suspected primary neoplasm). Non-contrast chest CT can be considered in 12 months if patient is high-risk. This recommendation follows the consensus statement: Guidelines for Management of Incidental Pulmonary Nodules Detected on CT Images: From the Fleischner Society 2017; Radiology 2017; 284:228-243. 2. Aortic atherosclerosis, in addition to left anterior descending coronary artery disease. Please note that although the presence of coronary artery calcium documents the presence of coronary artery disease, the severity of this disease and any potential stenosis cannot be assessed on this non-gated CT examination. Assessment for potential risk factor modification, dietary therapy or pharmacologic therapy may be warranted, if clinically indicated. 3. Diffuse bronchial wall thickening with moderate centrilobular and paraseptal emphysema; imaging findings suggestive of underlying COPD. Aortic Atherosclerosis (ICD10-I70.0) and Emphysema (ICD10-J43.9). Electronically Signed   By: Trudie Reed M.D.   On: 03/12/2021 17:54   MR Brain W and Wo Contrast  Result Date: 03/12/2021 CLINICAL DATA:  Dizziness, neuro deficit EXAM: MRI HEAD WITHOUT AND WITH CONTRAST TECHNIQUE: Multiplanar, multiecho pulse sequences of the brain and surrounding structures were obtained without and with intravenous contrast. CONTRAST:  9mL GADAVIST GADOBUTROL 1 MMOL/ML IV SOLN COMPARISON:  Same-day noncontrast CT head FINDINGS: Brain: There is a subacute infarct in the right lentiform nucleus extending to the caudate body. There is no evidence of associated hemorrhage. There is no other acute infarct. The brain parenchyma is otherwise normal in appearance. The ventricles are normal in size. There is no mass lesion. There is no midline shift. There is no abnormal enhancement. Vascular:  Normal flow voids. Skull and upper cervical spine: Normal marrow signal. Sinuses/Orbits: There is complete opacification of the left maxillary sinus and bilateral frontal sinuses. Associated  hyperostosis was seen on the CT. Other: A focus of diffusion restriction in the right parotid gland likely reflects an intraparotid lymph node. There is trace fluid in the mastoid air cells. IMPRESSION: 1. Subacute infarct in the right lentiform nucleus extending to the caudate body. 2. 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 Electronically Signed   By: Lesia Hausen M.D.   On: 03/12/2021 14:45    Labs on Admission: I have personally reviewed following labs CBC: Recent Labs  Lab 03/12/21 1013  WBC 8.5  NEUTROABS 4.4  HGB 15.4  HCT 44.1  MCV 93.2  PLT 208   Basic Metabolic Panel: Recent Labs  Lab 03/12/21 1013  NA 141  K 4.4  CL 101  CO2 28  GLUCOSE 85  BUN 15  CREATININE 0.84  CALCIUM 9.5   GFR: Estimated Creatinine Clearance: 88.5 mL/min (by C-G formula based on SCr of 0.84 mg/dL).  Liver Function Tests: Recent Labs  Lab 03/12/21 1013  AST 17  ALT 9  ALKPHOS 60  BILITOT 0.8  PROT 8.0  ALBUMIN 4.5   Dr. Sedalia Muta [facility]  If 7PM-7AM, please contact overnight-coverage provider If 7AM-7PM, please contact day coverage provider [email or web address]  03/12/2021, 6:37 PM

## 2021-03-12 NOTE — ED Provider Notes (Signed)
[facility] Emergency Department Provider Note   ____________________________________________   Event Date/Time   First MD Initiated Contact with Patient 03/12/21 1120     (approximate)  I have reviewed the triage vital signs and the nursing notes.   HISTORY  Chief Complaint Dizziness    HPI Nicholas Lang is a 60 y.o. male with no significant past medical history who presents to the ED complaining of dizziness.  Patient reports that he has been dealing with intermittent dizziness and weakness for about the past 5 days.  He states that when he will get up to do something or when he tries to go to work he becomes very dizzy and lightheaded with only minimal exertion.  He states he feels globally weak and will have to sit down to rest frequently, making it difficult for him to work.  He denies any fevers, cough, chest pain, shortness of breath, vomiting, diarrhea, or abdominal pain.  He does state that he has occasionally felt like his left arm is numb but he denies any weakness in his arm or any of his other extremities.  He was evaluated at the walk-in clinic 2 days ago and told that he was dehydrated, has been drinking plenty of fluids since then without improvement in symptoms.        History reviewed. No pertinent past medical history.  There are no problems to display for this patient.   History reviewed. No pertinent surgical history.  Prior to Admission medications   Not on File    Allergies Patient has no known allergies.  No family history on file.  Social History Social History   Tobacco Use   Smoking status: Every Day   Smokeless tobacco: Never  Substance Use Topics   Alcohol use: Not Currently    Review of Systems  Constitutional: No fever/chills.  Positive for dizziness and generalized weakness. Eyes: No visual changes. ENT: No sore throat. Cardiovascular: Denies chest pain. Respiratory: Denies shortness of  breath. Gastrointestinal: No abdominal pain.  No nausea, no vomiting.  No diarrhea.  No constipation. Genitourinary: Negative for dysuria. Musculoskeletal: Negative for back pain. Skin: Negative for rash. Neurological: Negative for headaches or focal weakness, positive for left arm numbness.  ____________________________________________   PHYSICAL EXAM:  VITAL SIGNS: ED Triage Vitals  Enc Vitals Group     BP 03/12/21 0957 133/90     Pulse Rate 03/12/21 0957 63     Resp 03/12/21 0957 16     Temp 03/12/21 0957 98.2 F (36.8 C)     Temp Source 03/12/21 0957 Oral     SpO2 03/12/21 0957 95 %     Weight 03/12/21 0954 144 lb (65.3 kg)     Height 03/12/21 0954 6' (1.829 m)     Head Circumference --      Peak Flow --      Pain Score 03/12/21 0954 0     Pain Loc --      Pain Edu? --      Excl. in GC? --     Constitutional: Alert and oriented. Eyes: Conjunctivae are normal. Head: Atraumatic. Nose: No congestion/rhinnorhea. Mouth/Throat: Mucous membranes are moist. Neck: Normal ROM Cardiovascular: Normal rate, regular rhythm. Grossly normal heart sounds.  2+ radial pulses bilaterally. Respiratory: Normal respiratory effort.  No retractions. Lungs CTAB. Gastrointestinal: Soft and nontender. No distention. Genitourinary: deferred Musculoskeletal: No lower extremity tenderness nor edema. Neurologic:  Normal speech and language. No gross focal neurologic deficits are appreciated. Skin:  Skin is warm, dry and intact. No rash noted. Psychiatric: Mood and affect are normal. Speech and behavior are normal.  ____________________________________________   LABS (all labs ordered are listed, but only abnormal results are displayed)  Labs Reviewed  RESP PANEL BY RT-PCR (FLU A&B, COVID) [id number]  COMPREHENSIVE METABOLIC PANEL  CBC WITH DIFFERENTIAL/PLATELET  TROPONIN I (HIGH SENSITIVITY)   ____________________________________________  EKG  ED ECG REPORT I, Chesley Noon, the  attending physician, personally viewed and interpreted this ECG.   Date: 03/12/2021  EKG Time: 10:21  Rate: 58  Rhythm: sinus bradycardia  Axis: Normal  Intervals:none  ST&T Change: None   PROCEDURES  Procedure(s) performed (including Critical Care):  Procedures   ____________________________________________   INITIAL IMPRESSION / ASSESSMENT AND PLAN / ED COURSE      60 year old male with no significant past medical history presents to the ED complaining of 5 days of intermittent dizziness, lightheadedness, and generalized weakness with occasional feeling of numbness in his left arm.  Patient denies feeling like the room is spinning around him and he does not have any focal neurologic deficits on exam.  EKG shows no evidence of arrhythmia or ischemia and troponin is negative, low suspicion for cardiac etiology.  Additional labs are unremarkable, chest x-ray reviewed by me and shows no infiltrate, edema, or effusion.  CT head does show age-indeterminate right basal ganglia infarct, unclear if this could be contributing to his symptoms.  We will further assess with MRI and discussed with neurology.  Case discussed with Dr. Iver Nestle of neurology, who recommends proceeding with MRI with and without contrast to rule out malignancy as well as stroke.  If MRI demonstrates acute stroke, plan for admission for further work-up, otherwise patient may be discharged home with PCP and neurology follow-up.  Patient turned over to oncoming provider pending MRI results and reassessment.      ____________________________________________   FINAL CLINICAL IMPRESSION(S) / ED DIAGNOSES  Final diagnoses:  Dizziness     ED Discharge Orders     None        Note:  This document was prepared using Dragon voice recognition software and may include unintentional dictation errors.    Chesley Noon, MD 03/12/21 1455

## 2021-03-13 ENCOUNTER — Encounter: Payer: Self-pay | Admitting: Internal Medicine

## 2021-03-13 DIAGNOSIS — F17200 Nicotine dependence, unspecified, uncomplicated: Secondary | ICD-10-CM | POA: Diagnosis not present

## 2021-03-13 DIAGNOSIS — I639 Cerebral infarction, unspecified: Secondary | ICD-10-CM | POA: Diagnosis not present

## 2021-03-13 DIAGNOSIS — Z716 Tobacco abuse counseling: Secondary | ICD-10-CM | POA: Diagnosis not present

## 2021-03-13 DIAGNOSIS — R42 Dizziness and giddiness: Secondary | ICD-10-CM | POA: Diagnosis not present

## 2021-03-13 LAB — HEMOGLOBIN A1C
Hgb A1c MFr Bld: 5.6 % (ref 4.8–5.6)
Mean Plasma Glucose: 114 mg/dL

## 2021-03-13 LAB — CBC
HCT: 42.2 % (ref 39.0–52.0)
Hemoglobin: 14.9 g/dL (ref 13.0–17.0)
MCH: 33.1 pg (ref 26.0–34.0)
MCHC: 35.3 g/dL (ref 30.0–36.0)
MCV: 93.8 fL (ref 80.0–100.0)
Platelets: 177 10*3/uL (ref 150–400)
RBC: 4.5 MIL/uL (ref 4.22–5.81)
RDW: 12.1 % (ref 11.5–15.5)
WBC: 6.9 10*3/uL (ref 4.0–10.5)
nRBC: 0 % (ref 0.0–0.2)

## 2021-03-13 LAB — HIV ANTIBODY (ROUTINE TESTING W REFLEX): HIV Screen 4th Generation wRfx: NONREACTIVE

## 2021-03-13 LAB — COMPREHENSIVE METABOLIC PANEL
ALT: 9 U/L (ref 0–44)
AST: 14 U/L — ABNORMAL LOW (ref 15–41)
Albumin: 4.2 g/dL (ref 3.5–5.0)
Alkaline Phosphatase: 54 U/L (ref 38–126)
Anion gap: 9 (ref 5–15)
BUN: 16 mg/dL (ref 6–20)
CO2: 28 mmol/L (ref 22–32)
Calcium: 9.2 mg/dL (ref 8.9–10.3)
Chloride: 103 mmol/L (ref 98–111)
Creatinine, Ser: 0.81 mg/dL (ref 0.61–1.24)
GFR, Estimated: >60 mL/min (ref >60)
Glucose, Bld: 93 mg/dL (ref 70–99)
Potassium: 4.5 mmol/L (ref 3.5–5.1)
Sodium: 140 mmol/L (ref 135–145)
Total Bilirubin: 0.9 mg/dL (ref 0.3–1.2)
Total Protein: 7.2 g/dL (ref 6.5–8.1)

## 2021-03-13 LAB — LIPID PANEL
Cholesterol: 167 mg/dL (ref 0–200)
HDL: 32 mg/dL — ABNORMAL LOW (ref >40)
LDL Cholesterol: 115 mg/dL — ABNORMAL HIGH (ref 0–99)
Total CHOL/HDL Ratio: 5.2 RATIO
Triglycerides: 98 mg/dL (ref <150)
VLDL: 20 mg/dL (ref 0–40)

## 2021-03-13 LAB — ECHOCARDIOGRAM COMPLETE
Area-P 1/2: 1.76 cm2
Height: 72 in
S' Lateral: 2.9 cm
Weight: 2304 oz

## 2021-03-13 LAB — VITAMIN B12: Vitamin B-12: 278 pg/mL (ref 180–914)

## 2021-03-13 MED ORDER — ASPIRIN 81 MG PO CHEW: 81.0000 mg | CHEWABLE_TABLET | Freq: Every day | ORAL | Status: AC

## 2021-03-13 MED ORDER — CLOPIDOGREL BISULFATE 75 MG PO TABS: 75.0000 mg | ORAL_TABLET | Freq: Every day | ORAL | 0 refills | Status: AC

## 2021-03-13 MED ORDER — NICOTINE 21 MG/24HR TD PT24: 21.0000 mg | MEDICATED_PATCH | Freq: Every day | TRANSDERMAL | 0 refills | Status: AC

## 2021-03-13 MED ORDER — ATORVASTATIN CALCIUM 40 MG PO TABS: 40.0000 mg | ORAL_TABLET | Freq: Every day | ORAL | 0 refills | Status: AC

## 2021-03-13 NOTE — Progress Notes (Signed)
OT Cancellation Note  Patient Details Name: Nicholas Lang MRN: [id number] DOB: 04-25-1961   Cancelled Treatment:    Reason Eval/Treat Not Completed: OT screened, no needs identified, will sign off Upon chart review and in speaking with Pt, noted that pt appears to have returned to his functional baseline. He is currently not requiring assistance with fxl mobility or ADLs. No acute OT needs perceived at this time, nor need for f/u services. Will complete order. Thank you.  Rejeana Brock, MS, OTR/L ascom [phone] 03/13/21, 10:55 AM

## 2021-03-13 NOTE — Progress Notes (Addendum)
SLP Cancellation Note  Patient Details Name: Nicholas Lang MRN: [id number] DOB: 13-Nov-1960   Cancelled treatment:       Reason Eval/Treat Not Completed: SLP screened, no needs identified, will sign off (chart reviewed; consulted pt in ED). Pt denied any difficulty swallowing from an oropharyngeal phase standpoint and is currently on a regular diet; tolerates swallowing pills w/ water per NSG. He DID c/o Esophageal phase Dysmotility and Discomfort, moreso w/ solids such as breads and meats. He denied being on a PPI, or having Reflux. Will updated MD.  Pt conversed in conversation w/out expressive/receptive deficits noted; pt denied any speech-language deficits. Speech clear. No further skilled ST services indicated as pt appears at his baseline. Pt agreed. NSG to reconsult if any change in status while admitted.       Jerilynn Som, MS, CCC-SLP Speech Language Pathologist Rehab Services [phone] [facility] 03/13/2021, 11:38 AM

## 2021-03-13 NOTE — Consult Note (Signed)
NEUROLOGY CONSULTATION NOTE   Date of service: March 13, 2021 Patient Name: Nicholas Lang MRN:  [id number] DOB:  11/05/1960 Reason for consult: acute ischemic stroke Requesting physician: Londell Moh MD _ _ _   _ __   _ __ _ _  __ __   _ __   __ _  History of Present Illness   This is a 60 year old gentleman with a past medical history of tobacco use who presented to the emergency department yesterday with complaints of dizziness and weakness for 5 days.  Symptoms occur with minimal exertion.  Dizziness is described as lightheadedness.  He states that he feels globally weak and has to sit down to rest frequently making it impossible for him to work.  He denies any signs or symptoms of recent infection.  Occasionally his left arm feels numb but he is not sure when this symptom developed.  He was seen in the walk-in clinic 2 days ago and told that he was dehydrated.  He has been drinking plenty of fluids since then without improvement in symptoms.  The dizziness and weakness is also associated with diaphoresis.  He denies dysphagia, chest pain, or shortness of breath.  He did lose his ability to taste on Thursday.  No nausea or vomiting.  He has never had a similar episode.  Workup this hospitalization:  MRI brain wwo Subacute infarct R lentiform nucleus extending to the caudate body    CTA H&N 1. No large vessel occlusion or proximal hemodynamically significant stenosis in the head or neck. 2. Multiple 3-4 mm pulmonary nodules versus scarring in the right lung apex. Given the patient's risk factors (smoking and emphysema), non-contrast chest CT is recommended in 12 months. This recommendation follows the consensus statement: Guidelines for Management of Incidental Pulmonary Nodules Detected on CT Images: From the Fleischner Society 2017; Radiology 2017; 284:228-243.  CNS imaging personally reviewed; I agree with above interpretations.  CT chest wo contrast 1. The findings on the recent  CTA of the head neck likely reflect areas of chronic post infectious or inflammatory scarring in the lung apices. There are multiple small 2-5 mm pulmonary nodules noted elsewhere in the lungs bilaterally, nonspecific, but statistically likely benign. No follow-up needed if patient is low-risk (and has no known or suspected primary neoplasm). Non-contrast chest CT can be considered in 12 months if patient is high-risk. This recommendation follows the consensus statement: Guidelines for Management of Incidental Pulmonary Nodules Detected on CT Images: From the Fleischner Society 2017; Radiology 2017; 284:228-243. 2. Aortic atherosclerosis, in addition to left anterior descending coronary artery disease. Please note that although the presence of coronary artery calcium documents the presence of coronary artery disease, the severity of this disease and any potential stenosis cannot be assessed on this non-gated CT examination. Assessment for potential risk factor modification, dietary therapy or pharmacologic therapy may be warranted, if clinically indicated. 3. Diffuse bronchial wall thickening with moderate centrilobular and paraseptal emphysema; imaging findings suggestive of underlying COPD.  LDL 115 A1c pending  TTE Left ventricular ejection fraction, by estimation, is 55 to 60%. The left ventricle has normal function. The left ventricle has no regional wall motion abnormalities. Left ventricular diastolic parameters were normal. 2. Right ventricular systolic function is normal. The right ventricular size is normal. 3. The mitral valve is normal in structure. No evidence of mitral valve regurgitation. 4. The aortic valve was not well visualized. Aortic valve regurgitation is not visualized. The inferior vena cava is normal in size with  greater than 50% respiratory variability, suggesting right atrial pressure of 3 mmHg.    ROS   Per HPI; all other systems reviewed and are  negative  Past History   Past Medical History:  Diagnosis Date  . Tobacco abuse    History reviewed. No pertinent surgical history. History reviewed. No pertinent family history. Social History   Socioeconomic History  . Marital status: Married    Spouse name: Not on file  . Number of children: Not on file  . Years of education: Not on file  . Highest education level: Not on file  Occupational History  . Not on file  Tobacco Use  . Smoking status: Every Day    Types: Cigarettes  . Smokeless tobacco: Never  Substance and Sexual Activity  . Alcohol use: Not Currently  . Drug use: Not Currently  . Sexual activity: Yes  Other Topics Concern  . Not on file  Social History Narrative  . Not on file   Social Determinants of Health   Financial Resource Strain: Not on file  Food Insecurity: Not on file  Transportation Needs: Not on file  Physical Activity: Not on file  Stress: Not on file  Social Connections: Not on file   No Known Allergies  Medications    Current Facility-Administered Medications:  .   stroke: mapping our early stages of recovery book, , Does not apply, Once, Cox, Amy N, DO .  acetaminophen (TYLENOL) tablet 650 mg, 650 mg, Oral, Q4H PRN **OR** acetaminophen (TYLENOL) 160 MG/5ML solution 650 mg, 650 mg, Per Tube, Q4H PRN **OR** acetaminophen (TYLENOL) suppository 650 mg, 650 mg, Rectal, Q4H PRN, Cox, Amy N, DO .  aspirin chewable tablet 81 mg, 81 mg, Oral, Daily, Cox, Amy N, DO, 81 mg at 03/13/21 0932 .  hydrALAZINE (APRESOLINE) tablet 10 mg, 10 mg, Oral, Q6H PRN, Cox, Amy N, DO .  nicotine (NICODERM CQ - dosed in mg/24 hours) patch 21 mg, 21 mg, Transdermal, Once, Chesley Noon, MD, 21 mg at 03/12/21 1446 .  nicotine (NICODERM CQ - dosed in mg/24 hours) patch 21 mg, 21 mg, Transdermal, Daily, Cox, Amy N, DO, 21 mg at 03/13/21 0932 No current outpatient medications on file.     Vitals   Vitals:   03/12/21 2304 03/13/21 0000 03/13/21 0202 03/13/21  0703  BP:  107/67 110/64 111/73  Pulse:  62 64 (!) 54  Resp:   15 16  Temp: 98.1 F (36.7 C)  97.7 F (36.5 C)   TempSrc: Oral  Oral   SpO2:  95% 96% 94%  Weight:      Height:         Body mass index is 19.53 kg/m.  Physical Exam   By time of my examination, patient had returned to functional baseline and had no neurologic deficits  Physical Exam Gen: A&O x4, NAD HEENT: Atraumatic, normocephalic;mucous membranes moist; oropharynx clear, tongue without atrophy or fasciculations. Neck: Supple, trachea midline. Resp: CTAB, no w/r/r CV: RRR, no m/g/r; nml S1 and S2. 2+ symmetric peripheral pulses. Abd: soft/NT/ND; nabs x 4 quad Extrem: Nml bulk; no cyanosis, clubbing, or edema.  Neuro: *MS: A&O x4. Follows multi-step commands.  *Speech: fluid, nondysarthric, able to name and repeat *CN:    I: Deferred   II,III: PERRLA, VFF by confrontation, optic discs not visualized 2/2 pupillary constriction   III,IV,VI: EOMI w/o nystagmus, no ptosis   V: Sensation intact from V1 to V3 to LT   VII: Eyelid closure was full.  Smile symmetric.   VIII: Hearing intact to voice   IX,X: Voice normal, palate elevates symmetrically    XI: SCM/trap 5/5 bilat   XII: Tongue protrudes midline, no atrophy or fasciculations   *Motor:   Normal bulk.  No tremor, rigidity or bradykinesia. No pronator drift.    Strength: Dlt Bic Tri WrE WrF FgS Gr HF KnF KnE PlF DoF    Left 5 5 5 5 5 5 5 5 5 5 5 5     Right 5 5 5 5 5 5 5 5 5 5 5 5    *Sensory: Intact to light touch, pinprick, temperature vibration throughout. Symmetric. Propioception intact bilat.  No double-simultaneous extinction.  *Coordination:  Finger-to-nose, heel-to-shin, rapid alternating motions were intact. *Reflexes:  2+ and symmetric throughout without clonus; toes down-going bilat *Gait: normal base, normal stride, normal turn. Negative Romberg.  NIHSS = 0   Premorbid mRS = 1   Labs   CBC:  Recent Labs  Lab 03/12/21 1013  03/13/21 0708  WBC 8.5 6.9  NEUTROABS 4.4  --   HGB 15.4 14.9  HCT 44.1 42.2  MCV 93.2 93.8  PLT 208 177    Basic Metabolic Panel:  Lab Results  Component Value Date   NA 140 03/13/2021   K 4.5 03/13/2021   CO2 28 03/13/2021   GLUCOSE 93 03/13/2021   BUN 16 03/13/2021   CREATININE 0.81 03/13/2021   CALCIUM 9.2 03/13/2021   GFRNONAA >60 03/13/2021   GFRAA >60 06/19/2019   Lipid Panel:  Lab Results  Component Value Date   LDLCALC 115 (H) 03/13/2021   HgbA1c: No results found for: HGBA1C Urine Drug Screen: No results found for: LABOPIA, COCAINSCRNUR, LABBENZ, AMPHETMU, THCU, LABBARB  Alcohol Level No results found for: ETH   Impression   This is a 60 year old gentleman with a past medical history of tobacco use who presented to the emergency department yesterday with complaints of dizziness and weakness for 5 days and was found to have subacute infarct R lentiform nucleus extending to the caudate body. Stroke workup is completed.  Recommendations   - ASA 81mg  daily + plavix 75mg  daily x21 days f/b ASA 81mg  daily monotherapy after that  - Goal normotension 6 days out from sx onset, avoid hypotension - Atorvastatin 40mg  daily for LDL 115. If diabetic by A1c, increase to atorvastatin 80mg  - q4 hr neuro checks - STAT head CT for any change in neuro exam - Tele; if unrevealing consider amb cardiac monitor after discharge - PT/OT/SLP - Stroke education - Amb referral to neurology upon discharge    ______________________________________________________________________   Thank you for the opportunity to take part in the care of this patient. If you have any further questions, please contact the neurology consultation attending.  Signed,  03/15/2021, MD [facility] [phone]  If 7pm- 7am, please page neurology on call as listed in AMION.

## 2021-03-13 NOTE — ED Notes (Signed)
Pt ambulating in hall to go to the bathroom, he is not wanting the TED hose on at this time.

## 2021-03-13 NOTE — ED Notes (Signed)
Patient reports no current c/o at this time.  Patient resting quietly.

## 2021-03-13 NOTE — ED Notes (Signed)
Resumed care from [facility].  Pt alert. Iv in place.  Pt in hallway bed waiting on admission.

## 2021-03-13 NOTE — ED Notes (Signed)
Patient is resting comfortably with eyes closed. Respirations non labored and equal. Will continue to monitor for changes.   

## 2021-03-13 NOTE — ED Notes (Signed)
Pt eating dinner meal  ?

## 2021-03-13 NOTE — ED Notes (Signed)
Pt ambulatory to bathroom without assistance. Pt denies dizziness and denies pain.

## 2021-03-13 NOTE — Discharge Summary (Signed)
Physician Discharge Summary  Nicholas Lang BJY:[id number] DOB: [birth year] DOA: 03/12/2021  PCP: Patient, No Pcp Per (Inactive)  Admit date: 03/12/2021 Discharge date: 03/13/2021  Discharge disposition: Home   Recommendations for Outpatient Follow-Up:   1. Establish care with PCP as soon as possible, preferably within 1 to 2 weeks.  2. Follow-up with neurologist in 1 month   Discharge Diagnosis:   Principal Problem:   Stroke [facility]) Active Problems:   Tobacco dependence   Encounter for tobacco use cessation counseling   Nodule of apex of right lung    Discharge Condition: Stable.  Diet recommendation:  Diet Order             Diet - low sodium heart healthy           Diet Heart Room service appropriate? Yes; Fluid consistency: Thin  Diet effective now                     Code Status: Full Code     Hospital Course:   Mr. Nicholas Lang is a 60 year old man with medical history significant for tobacco use disorder, hearing impairment, who presented to the hospital because of generalized weakness, dizziness and diaphoresis.  He was found to have acute versus subacute ischemic stroke involving the right lentiform nucleus extending to the caudate body.  He was seen by the neurologist who recommended dual antiplatelet therapy with Lipitor and aspirin and Plavix for 21 days followed by low-dose aspirin monotherapy. He was evaluated by the physical therapist, occupational therapist and speech therapist.  He did not require any skilled needs of additional therapy.    Patient was also found to have asymptomatic COPD (emphysema) and bilateral multiple small (2 to 5 mm) pulmonary nodules.  Because patient is an active smoker, follow-up CT chest was recommended in 12 months per guidelines.  The importance of smoking cessation was reiterated.  He is feeling better and is deemed stable for discharge to home today.   Medical Consultants:   Neurologist   Discharge Exam:     Vitals:   03/13/21 0703 03/13/21 1126 03/13/21 1457 03/13/21 1524  BP: 111/73 115/69 117/62 102/81  Pulse: (!) 54 61 66 70  Resp: Temp:      TempSrc:      SpO2: 94% 97% 98% 96%  Weight:      Height:         GEN: NAD SKIN: No rash EYES: EOMI ENT: MMM CV: RRR PULM: CTA B ABD: soft, ND, NT, +BS CNS: AAO x 3, non focal EXT: No edema or tenderness   The results of significant diagnostics from this hospitalization (including imaging, microbiology, ancillary and laboratory) are listed below for reference.     Procedures and Diagnostic Studies:   CT ANGIO HEAD NECK W WO CM  Result Date: 03/12/2021 CLINICAL DATA:  Stroke/TIA, assess extracranial arteries Stroke/TIA, assess intracranial arteries EXAM: CT ANGIOGRAPHY HEAD AND NECK TECHNIQUE: Multidetector CT imaging of the head and neck was performed using the standard protocol during bolus administration of intravenous contrast. Multiplanar CT image reconstructions and MIPs were obtained to evaluate the vascular anatomy. Carotid stenosis measurements (when applicable) are obtained utilizing NASCET criteria, using the distal internal carotid diameter as the denominator. CONTRAST:  75mL OMNIPAQUE IOHEXOL 350 MG/ML SOLN COMPARISON:  Same day MRI and CT head. FINDINGS: CTA NECK FINDINGS Aortic arch: Great vessel origins are patent. Right carotid system: Mild atherosclerosis at the carotid bifurcation without greater than  50% stenosis. Left carotid system: Mild atherosclerosis at the carotid bifurcation without greater than 50% stenosis. Vertebral arteries: Right dominant. No significant (greater than 50%) stenosis. Skeleton: Moderate degenerative disease at C4-C5, C5-C6 and C6-C7 with disc height loss, endplate sclerosis and posterior disc osteophyte complexes. Other neck: No acute abnormality. Upper chest: Emphysema and pleuroparenchymal scarring and bilateral visualized apices. Multiple 3-4 mm nodular lesions in the right upper  lobe, which could represent scarring or nodules. The largest measures 4 mm on series 6, image 70). Review of the MIP images confirms the above findings CTA HEAD FINDINGS Anterior circulation: Bilateral intracranial ICAs, MCAs, and ACAs are patent without proximal hemodynamically significant stenosis. No aneurysm identified. Posterior circulation: Bilateral intradural vertebral arteries, basilar artery, and posterior cerebral arteries are patent without proximal hemodynamically significant stenosis. Right posterior communicating artery. No aneurysm identified. Venous sinuses: As permitted by contrast timing, patent. Anatomic variants: See above. Review of the MIP images confirms the above findings IMPRESSION: 1. No large vessel occlusion or proximal hemodynamically significant stenosis in the head or neck. 2. Multiple 3-4 mm pulmonary nodules versus scarring in the right lung apex. Given the patient's risk factors (smoking and emphysema), non-contrast chest CT is recommended in 12 months. This recommendation follows the consensus statement: Guidelines for Management of Incidental Pulmonary Nodules Detected on CT Images: From the Fleischner Society 2017; Radiology 2017; 284:228-243. Electronically Signed   By: Feliberto Harts M.D.   On: 03/12/2021 16:14   DG Chest 2 View  Result Date: 03/12/2021 CLINICAL DATA:  Dizziness, chest pain EXAM: CHEST - 2 VIEW COMPARISON:  Report from study 09/13/2018.  Images not available. FINDINGS: Heart and mediastinal contours are within normal limits. No focal opacities or effusions. No acute bony abnormality. Chronic compression deformity in the lower thoracic spine, described on prior study. IMPRESSION: No active cardiopulmonary disease. Electronically Signed   By: Charlett Nose M.D.   On: 03/12/2021 10:52   CT HEAD WO CONTRAST ( )  Result Date: 03/12/2021 CLINICAL DATA:  TIA, dizziness EXAM: CT HEAD WITHOUT CONTRAST TECHNIQUE: Contiguous axial images were obtained from  the base of the skull through the vertex without intravenous contrast. COMPARISON:  06/10/2011 FINDINGS: Brain: Low-density noted in the right basal ganglia compatible with age-indeterminate lacunar infarct. No additional infarct. No hemorrhage or hydrocephalus. No mass effect or midline shift. Vascular: No hyperdense vessel or unexpected calcification. Skull: No acute calvarial abnormality. Sinuses/Orbits: Complete opacification of the frontal sinuses and upper ethmoid air cells. No air-fluid levels. Mastoid air cells clear. Other: None IMPRESSION: Age-indeterminate right basal ganglia lacunar infarct. This can be further evaluated with MRI if felt clinically indicated. Chronic sinusitis. Electronically Signed   By: Charlett Nose M.D.   On: 03/12/2021 10:51   CT CHEST WO CONTRAST  Result Date: 03/12/2021 CLINICAL DATA:  60 year old male with history of pulmonary nodules. EXAM: CT CHEST WITHOUT CONTRAST TECHNIQUE: Multidetector CT imaging of the chest was performed following the standard protocol without IV contrast. COMPARISON:  No priors. FINDINGS: Cardiovascular: Heart size is normal. There is no significant pericardial fluid, thickening or pericardial calcification. There is aortic atherosclerosis, as well as atherosclerosis of the great vessels of the mediastinum and the coronary arteries, including calcified atherosclerotic plaque in the left anterior descending coronary artery. Mediastinum/Nodes: No pathologically enlarged mediastinal or hilar lymph nodes. Please note that accurate exclusion of hilar adenopathy is limited on noncontrast CT scans. Esophagus is unremarkable in appearance. No axillary lymphadenopathy. Lungs/Pleura: Extensive pleuroparenchymal thickening and nodular architectural distortion in the apices of  both lungs, most likely to reflect areas of chronic post infectious or inflammatory scarring. Several scattered tiny 2-5 mm pulmonary nodules are noted elsewhere throughout the lungs,  largest of which is in the posterior aspect of the right lower lobe near the base (axial image 142 of series 3) measuring 5 mm, nonspecific, but statistically likely benign. No larger more suspicious appearing pulmonary nodules or masses are noted. No acute consolidative airspace disease. No pleural effusions. Diffuse bronchial wall thickening with moderate centrilobular and paraseptal emphysema. Upper Abdomen: Aortic atherosclerosis. Iodinated contrast material noted in the collecting systems of both kidneys related to recent contrast enhanced CT angio of the head and neck. Musculoskeletal: Chronic compression fracture of L1 with 60% loss of anterior vertebral body height. Multiple Schmorl's nodes are incidentally noted. There are no aggressive appearing lytic or blastic lesions noted in the visualized portions of the skeleton. IMPRESSION: 1. The findings on the recent CTA of the head neck likely reflect areas of chronic post infectious or inflammatory scarring in the lung apices. There are multiple small 2-5 mm pulmonary nodules noted elsewhere in the lungs bilaterally, nonspecific, but statistically likely benign. No follow-up needed if patient is low-risk (and has no known or suspected primary neoplasm). Non-contrast chest CT can be considered in 12 months if patient is high-risk. This recommendation follows the consensus statement: Guidelines for Management of Incidental Pulmonary Nodules Detected on CT Images: From the Fleischner Society 2017; Radiology 2017; 284:228-243. 2. Aortic atherosclerosis, in addition to left anterior descending coronary artery disease. Please note that although the presence of coronary artery calcium documents the presence of coronary artery disease, the severity of this disease and any potential stenosis cannot be assessed on this non-gated CT examination. Assessment for potential risk factor modification, dietary therapy or pharmacologic therapy may be warranted, if clinically  indicated. 3. Diffuse bronchial wall thickening with moderate centrilobular and paraseptal emphysema; imaging findings suggestive of underlying COPD. Aortic Atherosclerosis (ICD10-I70.0) and Emphysema (ICD10-J43.9). Electronically Signed   By: Trudie Reed M.D.   On: 03/12/2021 17:54   MR Brain W and Wo Contrast  Result Date: 03/12/2021 CLINICAL DATA:  Dizziness, neuro deficit EXAM: MRI HEAD WITHOUT AND WITH CONTRAST TECHNIQUE: Multiplanar, multiecho pulse sequences of the brain and surrounding structures were obtained without and with intravenous contrast. CONTRAST:  54mL GADAVIST GADOBUTROL 1 MMOL/ML IV SOLN COMPARISON:  Same-day noncontrast CT head FINDINGS: Brain: There is a subacute infarct in the right lentiform nucleus extending to the caudate body. There is no evidence of associated hemorrhage. There is no other acute infarct. The brain parenchyma is otherwise normal in appearance. The ventricles are normal in size. There is no mass lesion. There is no midline shift. There is no abnormal enhancement. Vascular: Normal flow voids. Skull and upper cervical spine: Normal marrow signal. Sinuses/Orbits: There is complete opacification of the left maxillary sinus and bilateral frontal sinuses. Associated hyperostosis was seen on the CT. Other: A focus of diffusion restriction in the right parotid gland likely reflects an intraparotid lymph node. There is trace fluid in the mastoid air cells. IMPRESSION: 1. Subacute infarct in the right lentiform nucleus extending to the caudate body. 2. 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 Electronically Signed   By: Lesia Hausen M.D.   On: 03/12/2021 14:45   ECHOCARDIOGRAM COMPLETE  Result Date: 03/13/2021    ECHOCARDIOGRAM REPORT   Patient Name:   Nicholas Lang Date of Exam: 03/12/2021 Medical Rec #:  [id number]    Height:  72.0 in Accession #:    [id number]   Weight:       144.0 lb Date of Birth:  [birth year]    BSA:          1.853 m Patient Age:     59 years     BP:           127/86 mmHg Patient Gender: M            HR:           58 bpm. Exam Location:  [facility] Procedure: 2D Echo, Cardiac Doppler and Color Doppler Indications:     I63.9 Stroke  History:         Patient has no prior history of Echocardiogram examinations.                  Risk Factors:Current Smoker.  Sonographer:     Daphine Deutscher RDCS Referring Phys:  [id number] AMY N COX Diagnosing Phys: Debbe Odea MD IMPRESSIONS  1. Left ventricular ejection fraction, by estimation, is 55 to 60%. The left ventricle has normal function. The left ventricle has no regional wall motion abnormalities. Left ventricular diastolic parameters were normal.  2. Right ventricular systolic function is normal. The right ventricular size is normal.  3. The mitral valve is normal in structure. No evidence of mitral valve regurgitation.  4. The aortic valve was not well visualized. Aortic valve regurgitation is not visualized.  5. The inferior vena cava is normal in size with greater than 50% respiratory variability, suggesting right atrial pressure of 3 mmHg. FINDINGS  Left Ventricle: Left ventricular ejection fraction, by estimation, is 55 to 60%. The left ventricle has normal function. The left ventricle has no regional wall motion abnormalities. The left ventricular internal cavity size was normal in size. There is  no left ventricular hypertrophy. Left ventricular diastolic parameters were normal. Right Ventricle: The right ventricular size is normal. No increase in right ventricular wall thickness. Right ventricular systolic function is normal. Left Atrium: Left atrial size was normal in size. Right Atrium: Right atrial size was normal in size. Pericardium: There is no evidence of pericardial effusion. Mitral Valve: The mitral valve is normal in structure. No evidence of mitral valve regurgitation. Tricuspid Valve: The tricuspid valve is normal in structure. Tricuspid valve regurgitation is trivial. Aortic  Valve: The aortic valve was not well visualized. Aortic valve regurgitation is not visualized. Pulmonic Valve: The pulmonic valve was not well visualized. Pulmonic valve regurgitation is not visualized. Aorta: The aortic root is normal in size and structure. Venous: The inferior vena cava is normal in size with greater than 50% respiratory variability, suggesting right atrial pressure of 3 mmHg. IAS/Shunts: No atrial level shunt detected by color flow Doppler.  LEFT VENTRICLE PLAX 2D LVIDd:         4.30 cm Diastology LVIDs:         2.90 cm LV e' medial:    6.53 cm/s LV PW:         0.90 cm LV E/e' medial:  7.5 LV IVS:        0.70 cm LV e' lateral:   10.80 cm/s                        LV E/e' lateral: 4.6  RIGHT VENTRICLE             IVC RV Basal diam:  2.90 cm     IVC diam: 1.70 cm RV  S prime:     10.20 cm/s TAPSE (M-mode): 1.4 cm LEFT ATRIUM             Index       RIGHT ATRIUM          Index LA diam:        3.40 cm 1.83 cm/m  RA Area:     9.11 cm LA Vol (A2C):   23.7 ml 12.79 ml/m RA Volume:   19.50 ml 10.52 ml/m LA Vol (A4C):   24.6 ml 13.28 ml/m LA Biplane Vol: 25.6 ml 13.82 ml/m   AORTA Ao Root diam: 3.40 cm MITRAL VALVE MV Area (PHT): 1.76 cm MV Decel Time: 430 msec MV E velocity: 49.30 cm/s MV A velocity: 54.40 cm/s MV E/A ratio:  0.91 Debbe Odea MD Electronically signed by Debbe Odea MD Signature Date/Time: 03/13/2021/1:09:12 PM    Final      Labs:   Basic Metabolic Panel: Recent Labs  Lab 03/12/21 1013 03/13/21 0708  NA 141 140  K 4.4 4.5  CL 101 103  CO2 28 28  GLUCOSE 85 93  BUN 15 16  CREATININE 0.84 0.81  CALCIUM 9.5 9.2   GFR Estimated Creatinine Clearance: 91.8 mL/min (by C-G formula based on SCr of 0.81 mg/dL). Liver Function Tests: Recent Labs  Lab 03/12/21 1013 03/13/21 0708  AST 17 14*  ALT 9 9  ALKPHOS 60 54  BILITOT 0.8 0.9  PROT 8.0 7.2  ALBUMIN 4.5 4.2   No results for input(s): LIPASE, AMYLASE in the last 168 hours. No results for input(s):  AMMONIA in the last 168 hours. Coagulation profile No results for input(s): INR, PROTIME in the last 168 hours.  CBC: Recent Labs  Lab 03/12/21 1013 03/13/21 0708  WBC 8.5 6.9  NEUTROABS 4.4  --   HGB 15.4 14.9  HCT 44.1 42.2  MCV 93.2 93.8  PLT 208 177   Cardiac Enzymes: No results for input(s): CKTOTAL, CKMB, CKMBINDEX, TROPONINI in the last 168 hours. BNP: Invalid input(s): POCBNP CBG: No results for input(s): GLUCAP in the last 168 hours. D-Dimer No results for input(s): DDIMER in the last 72 hours. Hgb A1c No results for input(s): HGBA1C in the last 72 hours. Lipid Profile Recent Labs    03/13/21 0708  CHOL 167  HDL 32*  LDLCALC 115*  TRIG 98  CHOLHDL 5.2   Thyroid function studies No results for input(s): TSH, T4TOTAL, T3FREE, THYROIDAB in the last 72 hours.  Invalid input(s): FREET3 Anemia work up Recent Labs    03/13/21 0708  VITAMINB12 278   Microbiology Recent Results (from the past 240 hour(s))  Resp Panel by RT-PCR (Flu A&B, Covid) Nasopharyngeal Swab     Status: None   Collection Time: 03/12/21 10:15 AM   Specimen: Nasopharyngeal Swab; Nasopharyngeal(NP) swabs in vial transport medium  Result Value Ref Range Status   SARS Coronavirus 2 by RT PCR NEGATIVE NEGATIVE Final    Comment: (NOTE) SARS-CoV-2 target nucleic acids are NOT DETECTED.  The SARS-CoV-2 RNA is generally detectable in upper respiratory specimens during the acute phase of infection. The lowest concentration of SARS-CoV-2 viral copies this assay can detect is 138 copies/mL. A negative result does not preclude SARS-Cov-2 infection and should not be used as the sole basis for treatment or other patient management decisions. A negative result may occur with  improper specimen collection/handling, submission of specimen other than nasopharyngeal swab, presence of viral mutation(s) within the areas targeted by this assay, and inadequate  number of viral copies(<138 copies/mL). A  negative result must be combined with clinical observations, patient history, and epidemiological information. The expected result is Negative.  Fact Sheet for Patients:  BloggerCourse.com  Fact Sheet for Healthcare Providers:  SeriousBroker.it  This test is no t yet approved or cleared by the [place] FDA and  has been authorized for detection and/or diagnosis of SARS-CoV-2 by FDA under an Emergency Use Authorization (EUA). This EUA will remain  in effect (meaning this test can be used) for the duration of the COVID-19 declaration under Section 564(b)(1) of the Act, 21 U.S.C.section 360bbb-3(b)(1), unless the authorization is terminated  or revoked sooner.       Influenza A by PCR NEGATIVE NEGATIVE Final   Influenza B by PCR NEGATIVE NEGATIVE Final    Comment: (NOTE) The Xpert Xpress SARS-CoV-2/FLU/RSV plus assay is intended as an aid in the diagnosis of influenza from Nasopharyngeal swab specimens and should not be used as a sole basis for treatment. Nasal washings and aspirates are unacceptable for Xpert Xpress SARS-CoV-2/FLU/RSV testing.  Fact Sheet for Patients: BloggerCourse.com  Fact Sheet for Healthcare Providers: SeriousBroker.it  This test is not yet approved or cleared by the [place] FDA and has been authorized for detection and/or diagnosis of SARS-CoV-2 by FDA under an Emergency Use Authorization (EUA). This EUA will remain in effect (meaning this test can be used) for the duration of the COVID-19 declaration under Section 564(b)(1) of the Act, 21 U.S.C. section 360bbb-3(b)(1), unless the authorization is terminated or revoked.  Performed at [facility], [address], [place], [place] [address]      Discharge Instructions:   Discharge Instructions     Ambulatory referral to Neurology   Complete by: As directed    An appointment  is requested in approximately: 4 weeks   Diet - low sodium heart healthy   Complete by: As directed    Increase activity slowly   Complete by: As directed       Allergies as of 03/13/2021   No Known Allergies      Medication List     TAKE these medications    aspirin 81 MG chewable tablet Chew 1 tablet (81 mg total) by mouth daily. Start taking on: March 14, 2021   atorvastatin 40 MG tablet Commonly known as: Lipitor Take 1 tablet (40 mg total) by mouth daily.   clopidogrel 75 MG tablet Commonly known as: Plavix Take 1 tablet (75 mg total) by mouth daily for 21 days.   nicotine 21 mg/24hr patch Commonly known as: NICODERM CQ - dosed in mg/24 hours Place 1 patch (21 mg total) onto the skin daily. Start taking on: March 14, 2021           If you experience worsening of your admission symptoms, develop shortness of breath, life threatening emergency, suicidal or homicidal thoughts you must seek medical attention immediately by calling 911 or calling your MD immediately  if symptoms less severe.   You must read complete instructions/literature along with all the possible adverse reactions/side effects for all the medicines you take and that have been prescribed to you. Take any new medicines after you have completely understood and accept all the possible adverse reactions/side effects.    Please note   You were cared for by a hospitalist during your hospital stay. If you have any questions about your discharge medications or the care you received while you were in the hospital after you are discharged, you can  call the unit and asked to speak with the hospitalist on call if the hospitalist that took care of you is not available. Once you are discharged, your primary care physician will handle any further medical issues. Please note that NO REFILLS for any discharge medications will be authorized once you are discharged, as it is imperative that you return to your  primary care physician (or establish a relationship with a primary care physician if you do not have one) for your aftercare needs so that they can reassess your need for medications and monitor your lab values.       Time coordinating discharge: 31 minutes  Signed:  Kai Calico  [facility] 03/13/2021, 4:48 PM   Pager on [email or web address]. If 7PM-7AM, please contact night-coverage at [email or web address]

## 2021-03-13 NOTE — Evaluation (Signed)
Physical Therapy Evaluation Patient Details Name: Nicholas Lang MRN: [id number] DOB: [birth year] Today's Date: 03/13/2021  History of Present Illness  Pt is a 60 year old male with no significant past medical history who presented to the ED complaining of 5 days of intermittent dizziness, lightheadedness, and generalized weakness with occasional feeling of numbness in his left arm.  Pt diagnosed with right lacunar stroke.   Clinical Impression  Pt was pleasant and motivated to participate during the session. Pt presented with no deficits in strength, sensation to light touch/proprioception, balance, or coordination.  Pt was able to ambulate with excellent cadence and stability including during start/stops, head turns, and rapid 180 deg turns.  No skilled PT needs identified at this time.  Will complete PT orders at this time but will reassess pt pending a change in status upon receipt of new PT orders.         Recommendations for follow up therapy are one component of a multi-disciplinary discharge planning process, led by the attending physician.  Recommendations may be updated based on patient status, additional functional criteria and insurance authorization.  Follow Up Recommendations No PT follow up    Equipment Recommendations  None recommended by PT    Recommendations for Other Services       Precautions / Restrictions Precautions Precautions: None Restrictions Weight Bearing Restrictions: No      Mobility  Bed Mobility Overal bed mobility: Independent                  Transfers Overall transfer level: Independent                  Ambulation/Gait Ambulation/Gait assistance: Independent Gait Distance (Feet): 300 Feet Assistive device: None Gait Pattern/deviations: WFL(Within Functional Limits) Gait velocity: WNL   General Gait Details: Good stability including during dynamic gait  Stairs            Wheelchair Mobility    Modified Rankin  (Stroke Patients Only)       Balance Overall balance assessment: Independent                                           Pertinent Vitals/Pain Pain Assessment: No/denies pain    Home Living Family/patient expects to be discharged to:: Private residence Living Arrangements: Spouse/significant other Available Help at Discharge: Family;Available PRN/intermittently Type of Home: House Home Access: Stairs to enter Entrance Stairs-Rails: Right;Left;Can reach both (Posts) Entrance Stairs-Number of Steps: 2 Home Layout: One level Home Equipment: None      Prior Function Level of Independence: Independent         Comments: Ind amb community distances without an AD, works FT in [employer] with extensive walking involved, no fall history, Ind with ADLs     Hand Dominance   Dominant Hand: Right    Extremity/Trunk Assessment   Upper Extremity Assessment Upper Extremity Assessment: Overall WFL for tasks assessed    Lower Extremity Assessment Lower Extremity Assessment: Overall WFL for tasks assessed       Communication   Communication: No difficulties  Cognition Arousal/Alertness: Awake/alert Behavior During Therapy: WFL for tasks assessed/performed Overall Cognitive Status: Within Functional Limits for tasks assessed  General Comments General comments (skin integrity, edema, etc.): Very good stability including during start/stops, head turns, and 180 deg turns during gait.  Pt able to easily achieve 10 sec SLS time on both legs, good static standing balance with eyes closed and feet together    Exercises Other Exercises Other Exercises: Reinforced importance of smoking cessation and getting medical attention quickly with onset of signs of stroke   Assessment/Plan    PT Assessment Patent does not need any further PT services  PT Problem List         PT Treatment Interventions      PT  Goals (Current goals can be found in the Care Plan section)  Acute Rehab PT Goals PT Goal Formulation: All assessment and education complete, DC therapy    Frequency     Barriers to discharge        Co-evaluation               AM-PAC PT "6 Clicks" Mobility  Outcome Measure Help needed turning from your back to your side while in a flat bed without using bedrails?: None Help needed moving from lying on your back to sitting on the side of a flat bed without using bedrails?: None Help needed moving to and from a bed to a chair (including a wheelchair)?: None Help needed standing up from a chair using your arms (e.g., wheelchair or bedside chair)?: None Help needed to walk in hospital room?: None Help needed climbing 3-5 steps with a railing? : None 6 Click Score: 24    End of Session Equipment Utilized During Treatment: Gait belt Activity Tolerance: Patient tolerated treatment well Patient left: in bed;with nursing/sitter in room;Other (comment) (Pt in ED HA next to nursing station with both bed rails up as found) Nurse Communication: Mobility status PT Visit Diagnosis: Muscle weakness (generalized) (M62.81)    Time: 4097-3532 PT Time Calculation (min) (ACUTE ONLY): 24 min   Charges:   PT Evaluation $PT Eval Low Complexity: 1 Low        D. Scott Tymia Streb PT, DPT 03/13/21, 10:21 AM

## 2021-03-14 ENCOUNTER — Encounter: Payer: Self-pay | Admitting: Neurology

## 2021-03-15 DIAGNOSIS — I639 Cerebral infarction, unspecified: Secondary | ICD-10-CM | POA: Diagnosis not present

## 2021-03-22 DIAGNOSIS — Z09 Encounter for follow-up examination after completed treatment for conditions other than malignant neoplasm: Secondary | ICD-10-CM | POA: Diagnosis not present

## 2021-03-22 DIAGNOSIS — I639 Cerebral infarction, unspecified: Secondary | ICD-10-CM | POA: Diagnosis not present

## 2021-03-22 DIAGNOSIS — F17200 Nicotine dependence, unspecified, uncomplicated: Secondary | ICD-10-CM | POA: Diagnosis not present

## 2021-04-05 ENCOUNTER — Ambulatory Visit: Payer: 59 | Admitting: Neurology

## 2021-04-08 DIAGNOSIS — I639 Cerebral infarction, unspecified: Secondary | ICD-10-CM | POA: Diagnosis not present

## 2021-07-02 DIAGNOSIS — F17200 Nicotine dependence, unspecified, uncomplicated: Secondary | ICD-10-CM | POA: Diagnosis not present

## 2021-07-02 DIAGNOSIS — I639 Cerebral infarction, unspecified: Secondary | ICD-10-CM | POA: Diagnosis not present

## 2021-09-23 DIAGNOSIS — Z0000 Encounter for general adult medical examination without abnormal findings: Secondary | ICD-10-CM | POA: Diagnosis not present

## 2021-09-23 DIAGNOSIS — R911 Solitary pulmonary nodule: Secondary | ICD-10-CM | POA: Diagnosis not present

## 2021-09-23 DIAGNOSIS — N529 Male erectile dysfunction, unspecified: Secondary | ICD-10-CM | POA: Diagnosis not present

## 2021-09-23 DIAGNOSIS — Z23 Encounter for immunization: Secondary | ICD-10-CM | POA: Diagnosis not present

## 2021-09-23 DIAGNOSIS — Z125 Encounter for screening for malignant neoplasm of prostate: Secondary | ICD-10-CM | POA: Diagnosis not present

## 2021-09-23 DIAGNOSIS — R5383 Other fatigue: Secondary | ICD-10-CM | POA: Diagnosis not present

## 2021-09-23 DIAGNOSIS — F17200 Nicotine dependence, unspecified, uncomplicated: Secondary | ICD-10-CM | POA: Diagnosis not present

## 2021-09-23 DIAGNOSIS — I639 Cerebral infarction, unspecified: Secondary | ICD-10-CM | POA: Diagnosis not present

## 2021-09-27 DIAGNOSIS — J22 Unspecified acute lower respiratory infection: Secondary | ICD-10-CM | POA: Diagnosis not present

## 2021-09-27 DIAGNOSIS — R051 Acute cough: Secondary | ICD-10-CM | POA: Diagnosis not present

## 2021-10-08 DIAGNOSIS — R972 Elevated prostate specific antigen [PSA]: Secondary | ICD-10-CM | POA: Diagnosis not present

## 2021-10-28 ENCOUNTER — Encounter: Payer: Self-pay | Admitting: Urology

## 2021-10-28 ENCOUNTER — Ambulatory Visit (INDEPENDENT_AMBULATORY_CARE_PROVIDER_SITE_OTHER): Payer: 59 | Admitting: Urology

## 2021-10-28 VITALS — BP 124/79 | HR 51 | Ht 71.0 in | Wt 145.0 lb

## 2021-10-28 DIAGNOSIS — R972 Elevated prostate specific antigen [PSA]: Secondary | ICD-10-CM

## 2021-10-28 NOTE — Progress Notes (Signed)
   10/28/2021 8:52 AM   Nicholas Lang September 13, 1960 [id number]  Referring provider: Donnamarie Rossetti, PA-C [place],  [place] [address]  Chief Complaint  Patient presents with   Elevated PSA    HPI: Nicholas Lang is a 61 y.o. male referred for evaluation of an elevated PSA.  PSA 09/23/2021 elevated 5.51 Repeat PSA 10/08/2021 was 5.94 Urinalysis was normal No prior PSA results available for comparison No bothersome LUTS Denies dysuria, gross hematuria Denies flank, abdominal or pelvic pain No family history prostate cancer   PMH: Past Medical History:  Diagnosis Date   Tobacco abuse     Surgical History: No past surgical history on file.  Home Medications:  Allergies as of 10/28/2021   No Known Allergies      Medication List        Accurate as of Oct 28, 2021  8:52 AM. If you have any questions, ask your nurse or doctor.          aspirin 81 MG chewable tablet Chew 1 tablet (81 mg total) by mouth daily.   atorvastatin 40 MG tablet Commonly known as: Lipitor Take 1 tablet (40 mg total) by mouth daily.   nicotine 21 mg/24hr patch Commonly known as: NICODERM CQ - dosed in mg/24 hours Place 1 patch (21 mg total) onto the skin daily.   sildenafil 20 MG tablet Commonly known as: REVATIO Take 2 to 3 tablets 30 minutes prior to intercourse.        Allergies: No Known Allergies  Family History: No family history on file.  Social History:  reports that he has been smoking cigarettes. He has never used smokeless tobacco. He reports that he does not currently use alcohol. He reports that he does not currently use drugs.   Physical Exam: BP 124/79   Pulse (!) 51   Ht 5\' 11"  (1.803 m)   Wt 145 lb (65.8 kg)   BMI 20.22 kg/m   Constitutional:  Alert and oriented, No acute distress. HEENT: [place] AT, moist mucus membranes.  Trachea midline, no masses. Cardiovascular: No clubbing, cyanosis, or edema. Respiratory: Normal respiratory  effort, no increased work of breathing. GI: Abdomen is soft, nontender, nondistended, no abdominal masses GU: Prostate 35 g, soft.  No nodules or induration Skin: No rashes, bruises or suspicious lesions. Neurologic: Grossly intact, no focal deficits, moving all 4 extremities. Psychiatric: Normal mood and affect.   Assessment & Plan:    1.  Elevated PSA Benign DRE Although PSA is a prostate cancer screening test he was informed that cancer is not the most common cause of an elevated PSA. Other potential causes including BPH and inflammation were discussed. He was informed that the only way to adequately diagnose prostate cancer would be a transrectal ultrasound and biopsy of the prostate. The procedure was discussed including potential risks of bleeding and infection/sepsis. He was also informed that a negative biopsy does not conclusively rule out the possibility that prostate cancer may be present and that continued monitoring is required. The use of newer adjunctive blood tests including 4kScore was discussed. The use of multiparametric prostate MRI to evaluate for abnormality suspicious for high-grade prostate cancer and aid in targeted biopsy was reviewed. Continued periodic surveillance was also discussed. He has elected to proceed with MRI    Abbie Sons, MD  [facility] [address], [place], [place] [address] [phone]

## 2021-11-01 ENCOUNTER — Encounter: Payer: Self-pay | Admitting: Urology

## 2021-11-21 ENCOUNTER — Ambulatory Visit
Admission: RE | Admit: 2021-11-21 | Discharge: 2021-11-21 | Disposition: A | Discharge status: Home / Self Care | Payer: 59 | Source: Ambulatory Visit | Attending: Urology | Admitting: Urology

## 2021-11-21 DIAGNOSIS — R972 Elevated prostate specific antigen [PSA]: Secondary | ICD-10-CM

## 2021-11-21 DIAGNOSIS — R590 Localized enlarged lymph nodes: Secondary | ICD-10-CM | POA: Diagnosis not present

## 2021-11-21 IMAGING — MR MR PROSTATE WO/W CM
10 of 11 series · 46 of 48 positions shown · IV contrast (gadavist)
Comparison: None Available.

CLINICAL DATA: Elevated PSA level of 5.94.  [2W]

EXAM:
MR PROSTATE WITHOUT AND WITH CONTRAST
TECHNIQUE: Multiplanar multisequence MRI images were obtained of the pelvis
centered about the prostate. Pre and post contrast images were
obtained.
CONTRAST:  7mL GADAVIST GADOBUTROL 1 MMOL/ML IV SOLN

[Series 3: ax in&out whole · axial · 3.0mm · 1.19mm/px · 1 of 88 slices shown (1 of 2)]
[im 1/88]
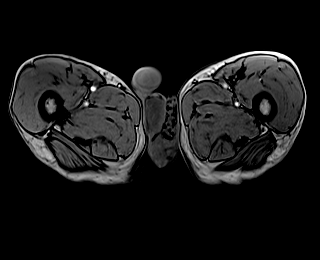

[Series 4: ax in&out whole · axial · 3.0mm · 1.19mm/px · z∈[-123,+138]mm · 2 of 88 slices shown (2 of 2)]
[im 1/88]
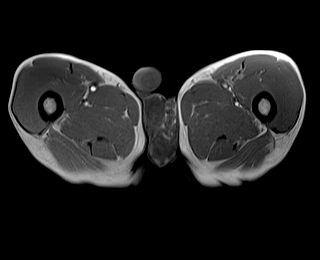
[im 88/88]
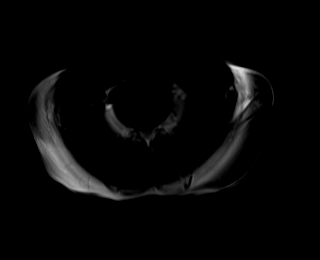

[Series 5: T2 · coronal · 3.0mm · 0.70mm/px · 1 of 35 slices shown (1 of 3)]
[im 1/35]
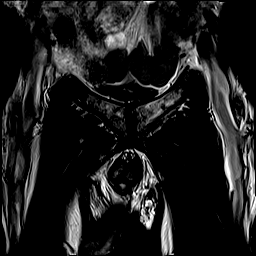

[Series 6: T2 · axial · 3.0mm · 0.56mm/px · 1 of 25 slices shown (2 of 3)]
[im 1/25]
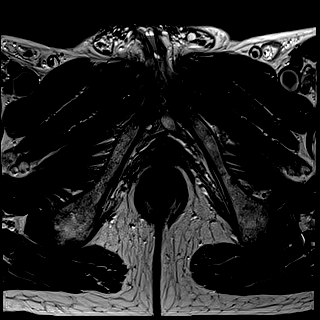

[Series 7: DWI · axial · 3.0mm · 0.86mm/px · z∈[-61,+11]mm · 2 of 75 slices shown (1 of 3)]
[im 1/75]
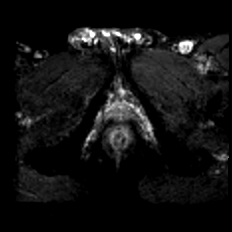
[im 75/75]
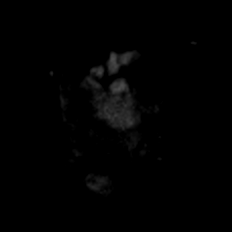

[Series 8: DWI · axial · 3.0mm · 0.86mm/px · 1 of 25 slices shown (2 of 3)]
[im 1/25]
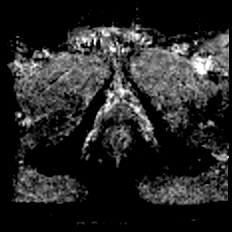

[Series 9: DWI · axial · 3.0mm · 0.86mm/px · 1 of 25 slices shown (3 of 3)]
[im 1/25]
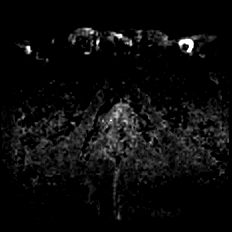

[Series 10: T2 · axial · 1.0mm · 1.04mm/px · z∈[-60,+11]mm · 2 of 72 slices shown (3 of 3)]
[im 1/72]
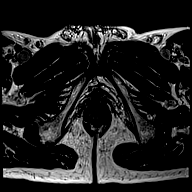
[im 72/72]
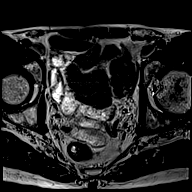

[Series 11: T1 · axial · 3.0mm · 1.15mm/px · z∈[-65,+16]mm · 18 of 700 slices shown (1 of 2)]
[im 1/700]
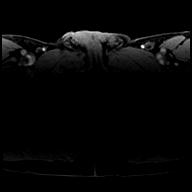
[im 42/700]
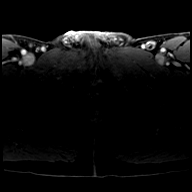
[im 83/700]
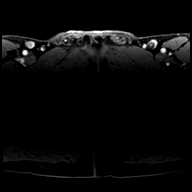
[im 124/700]
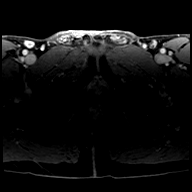
[im 165/700]
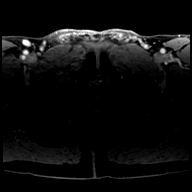
[im 206/700]
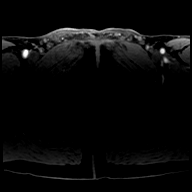
[im 247/700]
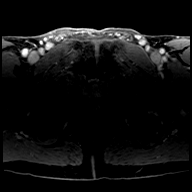
[im 288/700]
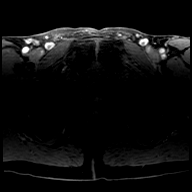
[im 329/700]
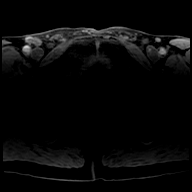
[im 371/700]
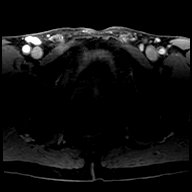
[im 412/700]
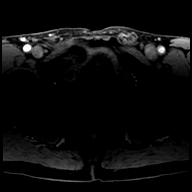
[im 453/700]
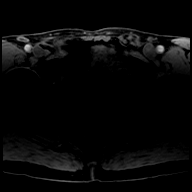
[im 494/700]
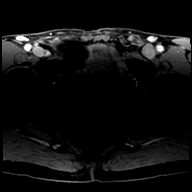
[im 535/700]
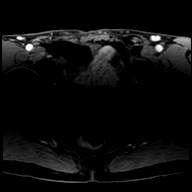
[im 576/700]
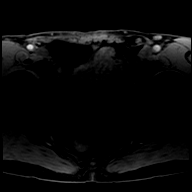
[im 617/700]
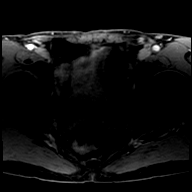
[im 658/700]
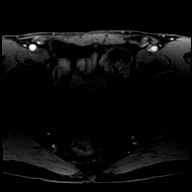
[im 700/700]
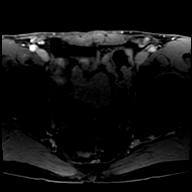

[Series 12: T1 · axial · 3.0mm · 1.15mm/px · z∈[-65,+16]mm · 17 of 672 slices shown (2 of 2)]
[im 1/672]
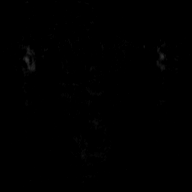
[im 42/672]
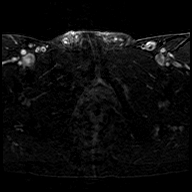
[im 84/672]
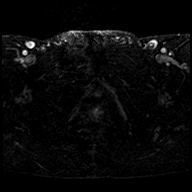
[im 126/672]
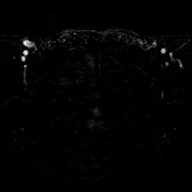
[im 168/672]
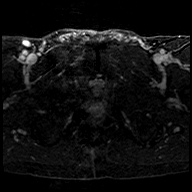
[im 210/672]
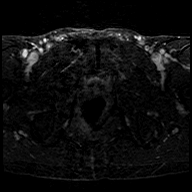
[im 252/672]
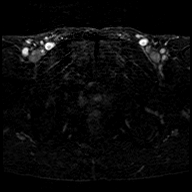
[im 294/672]
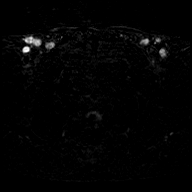
[im 336/672]
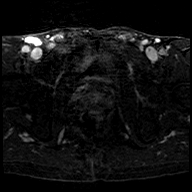
[im 378/672]
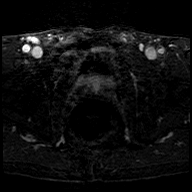
[im 420/672]
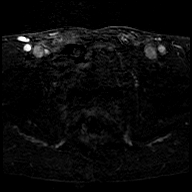
[im 462/672]
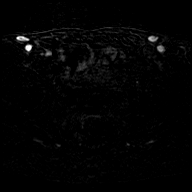
[im 504/672]
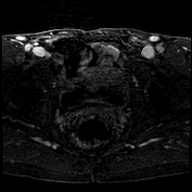
[im 546/672]
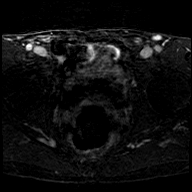
[im 588/672]
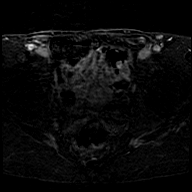
[im 630/672]
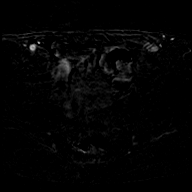
[im 672/672]
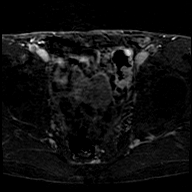

[46 of 48 positions shown; findings below may reference images not displayed]

FINDINGS: Prostate: Hazy low T2 stranding is present in both peripheral zones,
likely postinflammatory and considered PI-RADS category 2.

Region of interest # 1: PI-RADS category 3 lesion of the right
posterolateral peripheral zone in the mid gland, with mild focally
reduced ADC map activity (image 15 series 8) and mildly reduced T2
signal (image 45, series 10). This measures 0.22 cc (1.1 by 0.9 by
0.4 cm).

Volume: 3D volumetric assessment: Prostate volume 28.40 cc (4.5 by
3.5 by 3.7 cm).

Transcapsular spread:  Absent

Seminal vesicle involvement: Absent

Neurovascular bundle involvement: Absent

Pelvic adenopathy: Absent

Bone metastasis: Absent

Other findings: No supplemental non-categorized findings.
IMPRESSION: 1. Small PI-RADS category 3 lesion of the right posterolateral
peripheral zone in the mid gland. Targeting data sent to [facility].

## 2021-11-21 MED ORDER — GADOBUTROL 1 MMOL/ML IV SOLN
7.0000 mL | Freq: Once | INTRAVENOUS | Status: AC | PRN
  Administered 2021-11-21: 7 mL via INTRAVENOUS

## 2021-11-28 ENCOUNTER — Telehealth: Payer: Self-pay

## 2021-11-28 DIAGNOSIS — R972 Elevated prostate specific antigen [PSA]: Secondary | ICD-10-CM

## 2021-11-28 NOTE — Telephone Encounter (Signed)
Patient's wife called requesting MRI results. Please advise

## 2021-11-28 NOTE — Telephone Encounter (Signed)
Called patient to discuss MRI results and got voicemail.  Message left.  Also tried home phone number and no answer.  Voicemail was not set up

## 2021-11-29 NOTE — Telephone Encounter (Signed)
His MRI showed an indeterminant area in the prostate.  Based on his age and PSA level recommend scheduling fusion biopsy.  You can let her know that and if they have any questions I can call.  Patient's wife notified and I spoke to patient on the phone and they have both agreed to do the fusion Biopsy.

## 2021-12-13 ENCOUNTER — Telehealth: Payer: Self-pay

## 2021-12-13 NOTE — Telephone Encounter (Signed)
Tammy at [facility] called stating the cardiac clearance they received was not pt being cleared. The cardiologist deferred to PCP. I contacted PCP and they do not prescribe patients ASA. I put in a call with cardiologist nurse to call back regarding this.

## 2021-12-16 NOTE — Telephone Encounter (Signed)
Tammy from [facility] called and LM stating that she is calling to get more information in regards to this pt's fusion biopsy.

## 2022-06-05 DIAGNOSIS — Z79899 Other long term (current) drug therapy: Secondary | ICD-10-CM | POA: Diagnosis not present

## 2022-06-05 DIAGNOSIS — Z7982 Long term (current) use of aspirin: Secondary | ICD-10-CM | POA: Diagnosis not present

## 2022-06-05 DIAGNOSIS — I861 Scrotal varices: Secondary | ICD-10-CM | POA: Diagnosis not present

## 2022-06-05 DIAGNOSIS — J449 Chronic obstructive pulmonary disease, unspecified: Secondary | ICD-10-CM | POA: Diagnosis not present

## 2022-06-05 DIAGNOSIS — Z8673 Personal history of transient ischemic attack (TIA), and cerebral infarction without residual deficits: Secondary | ICD-10-CM | POA: Diagnosis not present

## 2022-06-05 DIAGNOSIS — R972 Elevated prostate specific antigen [PSA]: Secondary | ICD-10-CM | POA: Diagnosis not present

## 2022-06-05 DIAGNOSIS — R3129 Other microscopic hematuria: Secondary | ICD-10-CM | POA: Diagnosis not present

## 2022-06-18 DIAGNOSIS — R972 Elevated prostate specific antigen [PSA]: Secondary | ICD-10-CM | POA: Diagnosis not present

## 2022-06-18 DIAGNOSIS — N4289 Other specified disorders of prostate: Secondary | ICD-10-CM | POA: Diagnosis not present

## 2022-06-26 DIAGNOSIS — N4289 Other specified disorders of prostate: Secondary | ICD-10-CM | POA: Diagnosis not present

## 2022-06-26 DIAGNOSIS — R972 Elevated prostate specific antigen [PSA]: Secondary | ICD-10-CM | POA: Diagnosis not present

## 2022-06-26 DIAGNOSIS — C61 Malignant neoplasm of prostate: Secondary | ICD-10-CM | POA: Diagnosis not present

## 2022-07-02 DIAGNOSIS — I861 Scrotal varices: Secondary | ICD-10-CM | POA: Diagnosis not present

## 2022-07-03 DIAGNOSIS — C61 Malignant neoplasm of prostate: Secondary | ICD-10-CM | POA: Diagnosis not present

## 2022-07-07 DIAGNOSIS — I639 Cerebral infarction, unspecified: Secondary | ICD-10-CM | POA: Diagnosis not present

## 2022-07-07 DIAGNOSIS — F17218 Nicotine dependence, cigarettes, with other nicotine-induced disorders: Secondary | ICD-10-CM | POA: Diagnosis not present

## 2022-07-07 DIAGNOSIS — E7800 Pure hypercholesterolemia, unspecified: Secondary | ICD-10-CM | POA: Diagnosis not present

## 2022-07-10 DIAGNOSIS — C61 Malignant neoplasm of prostate: Secondary | ICD-10-CM | POA: Diagnosis not present

## 2022-07-17 DIAGNOSIS — C61 Malignant neoplasm of prostate: Secondary | ICD-10-CM | POA: Diagnosis not present

## 2022-08-12 DIAGNOSIS — C61 Malignant neoplasm of prostate: Secondary | ICD-10-CM | POA: Diagnosis not present

## 2022-08-21 DIAGNOSIS — Z8673 Personal history of transient ischemic attack (TIA), and cerebral infarction without residual deficits: Secondary | ICD-10-CM | POA: Diagnosis not present

## 2022-08-21 DIAGNOSIS — Z7982 Long term (current) use of aspirin: Secondary | ICD-10-CM | POA: Diagnosis not present

## 2022-08-21 DIAGNOSIS — I10 Essential (primary) hypertension: Secondary | ICD-10-CM | POA: Diagnosis not present

## 2022-08-21 DIAGNOSIS — C61 Malignant neoplasm of prostate: Secondary | ICD-10-CM | POA: Diagnosis not present

## 2022-08-21 DIAGNOSIS — J449 Chronic obstructive pulmonary disease, unspecified: Secondary | ICD-10-CM | POA: Diagnosis not present

## 2022-09-02 DIAGNOSIS — C61 Malignant neoplasm of prostate: Secondary | ICD-10-CM | POA: Diagnosis not present

## 2022-09-02 DIAGNOSIS — Z79899 Other long term (current) drug therapy: Secondary | ICD-10-CM | POA: Diagnosis not present

## 2022-09-02 DIAGNOSIS — Z8673 Personal history of transient ischemic attack (TIA), and cerebral infarction without residual deficits: Secondary | ICD-10-CM | POA: Diagnosis not present

## 2022-09-02 DIAGNOSIS — J449 Chronic obstructive pulmonary disease, unspecified: Secondary | ICD-10-CM | POA: Diagnosis not present

## 2022-09-02 DIAGNOSIS — F17210 Nicotine dependence, cigarettes, uncomplicated: Secondary | ICD-10-CM | POA: Diagnosis not present

## 2022-09-02 DIAGNOSIS — K5900 Constipation, unspecified: Secondary | ICD-10-CM | POA: Diagnosis not present

## 2023-01-19 DIAGNOSIS — E7800 Pure hypercholesterolemia, unspecified: Secondary | ICD-10-CM | POA: Diagnosis not present

## 2023-07-20 DIAGNOSIS — F17200 Nicotine dependence, unspecified, uncomplicated: Secondary | ICD-10-CM | POA: Diagnosis not present

## 2023-07-20 DIAGNOSIS — I639 Cerebral infarction, unspecified: Secondary | ICD-10-CM | POA: Diagnosis not present

## 2023-07-20 DIAGNOSIS — R2990 Unspecified symptoms and signs involving the nervous system: Secondary | ICD-10-CM | POA: Diagnosis not present

## 2023-07-20 DIAGNOSIS — E7800 Pure hypercholesterolemia, unspecified: Secondary | ICD-10-CM | POA: Diagnosis not present

## 2023-07-24 DIAGNOSIS — Z599 Problem related to housing and economic circumstances, unspecified: Secondary | ICD-10-CM | POA: Diagnosis not present

## 2023-07-24 DIAGNOSIS — G459 Transient cerebral ischemic attack, unspecified: Secondary | ICD-10-CM | POA: Diagnosis not present

## 2023-07-29 ENCOUNTER — Other Ambulatory Visit: Payer: Self-pay | Admitting: Family Medicine

## 2023-07-29 DIAGNOSIS — G459 Transient cerebral ischemic attack, unspecified: Secondary | ICD-10-CM

## 2023-08-21 ENCOUNTER — Ambulatory Visit
Admission: RE | Admit: 2023-08-21 | Discharge: 2023-08-21 | Disposition: A | Discharge status: Home / Self Care | Payer: 59 | Source: Ambulatory Visit | Attending: Family Medicine | Admitting: Family Medicine

## 2023-08-21 DIAGNOSIS — I639 Cerebral infarction, unspecified: Secondary | ICD-10-CM | POA: Diagnosis not present

## 2023-08-21 DIAGNOSIS — G459 Transient cerebral ischemic attack, unspecified: Secondary | ICD-10-CM

## 2024-02-23 DIAGNOSIS — R0683 Snoring: Secondary | ICD-10-CM | POA: Diagnosis not present

## 2024-02-23 DIAGNOSIS — R413 Other amnesia: Secondary | ICD-10-CM | POA: Diagnosis not present

## 2024-02-23 DIAGNOSIS — Z8546 Personal history of malignant neoplasm of prostate: Secondary | ICD-10-CM | POA: Diagnosis not present

## 2024-02-23 DIAGNOSIS — D649 Anemia, unspecified: Secondary | ICD-10-CM | POA: Diagnosis not present

## 2024-02-23 DIAGNOSIS — Z716 Tobacco abuse counseling: Secondary | ICD-10-CM | POA: Diagnosis not present

## 2024-02-23 DIAGNOSIS — Z8673 Personal history of transient ischemic attack (TIA), and cerebral infarction without residual deficits: Secondary | ICD-10-CM | POA: Diagnosis not present

## 2024-02-23 DIAGNOSIS — E7800 Pure hypercholesterolemia, unspecified: Secondary | ICD-10-CM | POA: Diagnosis not present

## 2024-02-23 DIAGNOSIS — R7303 Prediabetes: Secondary | ICD-10-CM | POA: Diagnosis not present

## 2024-02-23 DIAGNOSIS — K5900 Constipation, unspecified: Secondary | ICD-10-CM | POA: Diagnosis not present

## 2024-02-26 DIAGNOSIS — G939 Disorder of brain, unspecified: Secondary | ICD-10-CM | POA: Diagnosis not present

## 2024-02-26 DIAGNOSIS — R0683 Snoring: Secondary | ICD-10-CM | POA: Diagnosis not present

## 2024-02-26 DIAGNOSIS — R410 Disorientation, unspecified: Secondary | ICD-10-CM | POA: Diagnosis not present

## 2024-02-29 ENCOUNTER — Other Ambulatory Visit: Payer: Self-pay | Admitting: Neurology

## 2024-02-29 DIAGNOSIS — R410 Disorientation, unspecified: Secondary | ICD-10-CM

## 2024-03-18 ENCOUNTER — Ambulatory Visit
Admission: RE | Admit: 2024-03-18 | Discharge: 2024-03-18 | Disposition: A | Discharge status: Home / Self Care | Source: Ambulatory Visit | Attending: Neurology | Admitting: Neurology

## 2024-03-18 DIAGNOSIS — R410 Disorientation, unspecified: Secondary | ICD-10-CM

## 2024-03-18 MED ORDER — GADOPICLENOL 0.5 MMOL/ML IV SOLN
7.5000 mL | Freq: Once | INTRAVENOUS | Status: AC | PRN
Start: 2024-03-18 — End: 2024-03-18
  Administered 2024-03-18: 6 mL via INTRAVENOUS

## 2024-04-27 DIAGNOSIS — I639 Cerebral infarction, unspecified: Secondary | ICD-10-CM | POA: Diagnosis not present

## 2024-04-27 DIAGNOSIS — F17200 Nicotine dependence, unspecified, uncomplicated: Secondary | ICD-10-CM | POA: Diagnosis not present

## 2024-04-27 DIAGNOSIS — R911 Solitary pulmonary nodule: Secondary | ICD-10-CM | POA: Diagnosis not present

## 2024-04-27 DIAGNOSIS — C61 Malignant neoplasm of prostate: Secondary | ICD-10-CM | POA: Diagnosis not present

## 2024-04-27 DIAGNOSIS — R634 Abnormal weight loss: Secondary | ICD-10-CM | POA: Diagnosis not present

## 2024-05-02 DIAGNOSIS — I6381 Other cerebral infarction due to occlusion or stenosis of small artery: Secondary | ICD-10-CM | POA: Diagnosis not present

## 2024-05-11 DIAGNOSIS — F17200 Nicotine dependence, unspecified, uncomplicated: Secondary | ICD-10-CM | POA: Diagnosis not present

## 2024-05-11 DIAGNOSIS — I639 Cerebral infarction, unspecified: Secondary | ICD-10-CM | POA: Diagnosis not present

## 2024-05-11 DIAGNOSIS — R911 Solitary pulmonary nodule: Secondary | ICD-10-CM | POA: Diagnosis not present

## 2024-05-11 DIAGNOSIS — R634 Abnormal weight loss: Secondary | ICD-10-CM | POA: Diagnosis not present

## 2024-06-03 DIAGNOSIS — R222 Localized swelling, mass and lump, trunk: Secondary | ICD-10-CM | POA: Diagnosis not present

## 2024-06-03 DIAGNOSIS — N62 Hypertrophy of breast: Secondary | ICD-10-CM | POA: Diagnosis not present
